# Patient Record
Sex: Male | Born: 1960 | Race: White | Hispanic: No | Marital: Single | Smoking: Current every day smoker
Health system: Southern US, Community
[De-identification: ages and names within clinical notes are randomized; demographics above are authoritative.]

## PROBLEM LIST (undated history)

## (undated) DIAGNOSIS — K029 Dental caries, unspecified: Secondary | ICD-10-CM

## (undated) DIAGNOSIS — G8929 Other chronic pain: Secondary | ICD-10-CM

## (undated) DIAGNOSIS — E119 Type 2 diabetes mellitus without complications: Secondary | ICD-10-CM

## (undated) DIAGNOSIS — E1140 Type 2 diabetes mellitus with diabetic neuropathy, unspecified: Secondary | ICD-10-CM

## (undated) DIAGNOSIS — K219 Gastro-esophageal reflux disease without esophagitis: Secondary | ICD-10-CM

## (undated) DIAGNOSIS — Z974 Presence of external hearing-aid: Secondary | ICD-10-CM

## (undated) DIAGNOSIS — F319 Bipolar disorder, unspecified: Secondary | ICD-10-CM

## (undated) DIAGNOSIS — Z87442 Personal history of urinary calculi: Secondary | ICD-10-CM

## (undated) DIAGNOSIS — R42 Dizziness and giddiness: Secondary | ICD-10-CM

## (undated) DIAGNOSIS — E7800 Pure hypercholesterolemia, unspecified: Secondary | ICD-10-CM

## (undated) DIAGNOSIS — A4902 Methicillin resistant Staphylococcus aureus infection, unspecified site: Secondary | ICD-10-CM

## (undated) DIAGNOSIS — F32A Depression, unspecified: Secondary | ICD-10-CM

## (undated) DIAGNOSIS — F329 Major depressive disorder, single episode, unspecified: Secondary | ICD-10-CM

## (undated) DIAGNOSIS — M109 Gout, unspecified: Secondary | ICD-10-CM

## (undated) DIAGNOSIS — H9190 Unspecified hearing loss, unspecified ear: Secondary | ICD-10-CM

## (undated) DIAGNOSIS — F419 Anxiety disorder, unspecified: Secondary | ICD-10-CM

## (undated) DIAGNOSIS — M549 Dorsalgia, unspecified: Secondary | ICD-10-CM

## (undated) DIAGNOSIS — I10 Essential (primary) hypertension: Secondary | ICD-10-CM

## (undated) HISTORY — PX: MULTIPLE TOOTH EXTRACTIONS: SHX2053

## (undated) HISTORY — PX: TONSILLECTOMY: SUR1361

## (undated) HISTORY — PX: HERNIA REPAIR: SHX51

---

## 2009-06-20 ENCOUNTER — Emergency Department (HOSPITAL_COMMUNITY): Admission: EM | Admit: 2009-06-20 | Discharge: 2009-06-20 | Payer: Self-pay | Admitting: Emergency Medicine

## 2009-09-12 ENCOUNTER — Encounter: Admission: RE | Admit: 2009-09-12 | Discharge: 2009-09-12 | Payer: Self-pay | Admitting: Family Medicine

## 2009-11-28 ENCOUNTER — Emergency Department (HOSPITAL_COMMUNITY): Admission: EM | Admit: 2009-11-28 | Discharge: 2009-11-28 | Payer: Self-pay | Admitting: Emergency Medicine

## 2010-02-23 ENCOUNTER — Emergency Department (HOSPITAL_COMMUNITY): Admission: EM | Admit: 2010-02-23 | Discharge: 2010-02-23 | Payer: Self-pay | Admitting: Emergency Medicine

## 2010-06-29 ENCOUNTER — Emergency Department (HOSPITAL_COMMUNITY): Admission: EM | Admit: 2010-06-29 | Discharge: 2010-06-30 | Payer: Self-pay | Admitting: Emergency Medicine

## 2011-01-29 LAB — DIFFERENTIAL
Eosinophils Absolute: 0.1 10*3/uL (ref 0.0–0.7)
Lymphs Abs: 3.2 10*3/uL (ref 0.7–4.0)
Monocytes Relative: 8 % (ref 3–12)
Neutro Abs: 7.5 10*3/uL (ref 1.7–7.7)
Neutrophils Relative %: 64 % (ref 43–77)

## 2011-01-29 LAB — CULTURE, BLOOD (ROUTINE X 2)
Culture: NO GROWTH
Culture: NO GROWTH

## 2011-01-29 LAB — CBC
Hemoglobin: 15.1 g/dL (ref 13.0–17.0)
MCH: 29.6 pg (ref 26.0–34.0)
MCHC: 34.8 g/dL (ref 30.0–36.0)
MCV: 85.1 fL (ref 78.0–100.0)
RBC: 5.11 MIL/uL (ref 4.22–5.81)
RDW: 12.7 % (ref 11.5–15.5)

## 2011-01-29 LAB — BASIC METABOLIC PANEL
BUN: 9 mg/dL (ref 6–23)
Calcium: 10 mg/dL (ref 8.4–10.5)
Chloride: 101 mEq/L (ref 96–112)
GFR calc non Af Amer: >60 mL/min (ref >60)
Glucose, Bld: 119 mg/dL — ABNORMAL HIGH (ref 70–99)
Potassium: 4 mEq/L (ref 3.5–5.1)
Sodium: 142 mEq/L (ref 135–145)

## 2011-01-29 LAB — URINALYSIS, ROUTINE W REFLEX MICROSCOPIC
Bilirubin Urine: NEGATIVE
Glucose, UA: NEGATIVE mg/dL
Hgb urine dipstick: NEGATIVE
Nitrite: NEGATIVE
Protein, ur: NEGATIVE mg/dL
Urobilinogen, UA: 0.2 mg/dL (ref 0.0–1.0)

## 2011-03-28 ENCOUNTER — Observation Stay (HOSPITAL_COMMUNITY)
Admission: EM | Admit: 2011-03-28 | Discharge: 2011-03-28 | Discharge status: Against Medical Advice | Payer: Medicaid Other | Attending: Emergency Medicine | Admitting: Emergency Medicine

## 2011-03-28 ENCOUNTER — Encounter (HOSPITAL_COMMUNITY): Payer: Self-pay | Admitting: Radiology

## 2011-03-28 ENCOUNTER — Emergency Department (HOSPITAL_COMMUNITY): Payer: Medicaid Other

## 2011-03-28 DIAGNOSIS — L03211 Cellulitis of face: Principal | ICD-10-CM | POA: Insufficient documentation

## 2011-03-28 DIAGNOSIS — L0201 Cutaneous abscess of face: Principal | ICD-10-CM | POA: Insufficient documentation

## 2011-03-28 LAB — CBC
Hemoglobin: 16.2 g/dL (ref 13.0–17.0)
MCH: 29.2 pg (ref 26.0–34.0)
MCV: 82.2 fL (ref 78.0–100.0)
Platelets: 259 10*3/uL (ref 150–400)

## 2011-03-28 LAB — BASIC METABOLIC PANEL
CO2: 28 mEq/L (ref 19–32)
Calcium: 10.9 mg/dL — ABNORMAL HIGH (ref 8.4–10.5)
Chloride: 102 mEq/L (ref 96–112)
Creatinine, Ser: 0.8 mg/dL (ref 0.4–1.5)
GFR calc Af Amer: >60 mL/min (ref >60)
GFR calc non Af Amer: >60 mL/min (ref >60)

## 2011-03-28 LAB — DIFFERENTIAL
Basophils Relative: 0 % (ref 0–1)
Eosinophils Relative: 0 % (ref 0–5)
Lymphocytes Relative: 20 % (ref 12–46)
Lymphs Abs: 1.8 10*3/uL (ref 0.7–4.0)
Monocytes Absolute: 0.8 10*3/uL (ref 0.1–1.0)

## 2011-03-28 MED ORDER — IOHEXOL 300 MG/ML  SOLN
80.0000 mL | Freq: Once | INTRAMUSCULAR | Status: AC | PRN
  Administered 2011-03-28: 80 mL via INTRAVENOUS

## 2011-08-25 ENCOUNTER — Emergency Department (HOSPITAL_COMMUNITY)
Admission: EM | Admit: 2011-08-25 | Discharge: 2011-08-25 | Disposition: A | Discharge status: Home / Self Care | Payer: Medicaid Other | Attending: Emergency Medicine | Admitting: Emergency Medicine

## 2011-08-25 DIAGNOSIS — R221 Localized swelling, mass and lump, neck: Secondary | ICD-10-CM | POA: Insufficient documentation

## 2011-08-25 DIAGNOSIS — L03211 Cellulitis of face: Secondary | ICD-10-CM | POA: Insufficient documentation

## 2011-08-25 DIAGNOSIS — J701 Chronic and other pulmonary manifestations due to radiation: Secondary | ICD-10-CM | POA: Insufficient documentation

## 2011-08-25 DIAGNOSIS — L0201 Cutaneous abscess of face: Secondary | ICD-10-CM | POA: Insufficient documentation

## 2011-08-25 DIAGNOSIS — I10 Essential (primary) hypertension: Secondary | ICD-10-CM | POA: Insufficient documentation

## 2011-08-25 DIAGNOSIS — R220 Localized swelling, mass and lump, head: Secondary | ICD-10-CM | POA: Insufficient documentation

## 2011-08-25 DIAGNOSIS — Z8614 Personal history of Methicillin resistant Staphylococcus aureus infection: Secondary | ICD-10-CM | POA: Insufficient documentation

## 2011-08-25 DIAGNOSIS — R51 Headache: Secondary | ICD-10-CM | POA: Insufficient documentation

## 2011-08-25 DIAGNOSIS — Z79899 Other long term (current) drug therapy: Secondary | ICD-10-CM | POA: Insufficient documentation

## 2011-08-26 ENCOUNTER — Emergency Department (HOSPITAL_COMMUNITY)
Admission: EM | Admit: 2011-08-26 | Discharge: 2011-08-26 | Disposition: A | Discharge status: Home / Self Care | Payer: Medicaid Other | Attending: Emergency Medicine | Admitting: Emergency Medicine

## 2011-08-26 DIAGNOSIS — L0201 Cutaneous abscess of face: Secondary | ICD-10-CM | POA: Insufficient documentation

## 2011-08-26 DIAGNOSIS — F430 Acute stress reaction: Secondary | ICD-10-CM | POA: Insufficient documentation

## 2011-08-26 DIAGNOSIS — L03211 Cellulitis of face: Secondary | ICD-10-CM | POA: Insufficient documentation

## 2011-08-26 DIAGNOSIS — I10 Essential (primary) hypertension: Secondary | ICD-10-CM | POA: Insufficient documentation

## 2011-08-27 ENCOUNTER — Emergency Department (HOSPITAL_COMMUNITY)
Admission: EM | Admit: 2011-08-27 | Discharge: 2011-08-27 | Disposition: A | Discharge status: Home / Self Care | Payer: Medicaid Other | Attending: Emergency Medicine | Admitting: Emergency Medicine

## 2011-08-27 DIAGNOSIS — L0201 Cutaneous abscess of face: Secondary | ICD-10-CM | POA: Insufficient documentation

## 2011-08-27 DIAGNOSIS — A4902 Methicillin resistant Staphylococcus aureus infection, unspecified site: Secondary | ICD-10-CM | POA: Insufficient documentation

## 2011-08-27 DIAGNOSIS — K219 Gastro-esophageal reflux disease without esophagitis: Secondary | ICD-10-CM | POA: Insufficient documentation

## 2011-08-27 DIAGNOSIS — I10 Essential (primary) hypertension: Secondary | ICD-10-CM | POA: Insufficient documentation

## 2011-08-27 DIAGNOSIS — L03211 Cellulitis of face: Secondary | ICD-10-CM | POA: Insufficient documentation

## 2011-08-27 DIAGNOSIS — R234 Changes in skin texture: Secondary | ICD-10-CM | POA: Insufficient documentation

## 2011-08-27 DIAGNOSIS — Z862 Personal history of diseases of the blood and blood-forming organs and certain disorders involving the immune mechanism: Secondary | ICD-10-CM | POA: Insufficient documentation

## 2011-08-27 DIAGNOSIS — Z8639 Personal history of other endocrine, nutritional and metabolic disease: Secondary | ICD-10-CM | POA: Insufficient documentation

## 2012-02-17 ENCOUNTER — Emergency Department (HOSPITAL_COMMUNITY)
Admission: EM | Admit: 2012-02-17 | Discharge: 2012-02-17 | Disposition: A | Discharge status: Home / Self Care | Payer: Medicaid Other | Attending: Emergency Medicine | Admitting: Emergency Medicine

## 2012-02-17 ENCOUNTER — Encounter (HOSPITAL_COMMUNITY): Payer: Self-pay

## 2012-02-17 DIAGNOSIS — M549 Dorsalgia, unspecified: Secondary | ICD-10-CM | POA: Insufficient documentation

## 2012-02-17 DIAGNOSIS — F17200 Nicotine dependence, unspecified, uncomplicated: Secondary | ICD-10-CM | POA: Insufficient documentation

## 2012-02-17 DIAGNOSIS — I10 Essential (primary) hypertension: Secondary | ICD-10-CM | POA: Insufficient documentation

## 2012-02-17 DIAGNOSIS — G8929 Other chronic pain: Secondary | ICD-10-CM | POA: Insufficient documentation

## 2012-02-17 DIAGNOSIS — J340 Abscess, furuncle and carbuncle of nose: Secondary | ICD-10-CM

## 2012-02-17 DIAGNOSIS — M5431 Sciatica, right side: Secondary | ICD-10-CM

## 2012-02-17 HISTORY — DX: Essential (primary) hypertension: I10

## 2012-02-17 HISTORY — DX: Other chronic pain: G89.29

## 2012-02-17 HISTORY — DX: Dorsalgia, unspecified: M54.9

## 2012-02-17 MED ORDER — SULFAMETHOXAZOLE-TRIMETHOPRIM 800-160 MG PO TABS: 1.0000 | ORAL_TABLET | Freq: Two times a day (BID) | ORAL | Status: AC

## 2012-02-17 MED ORDER — OXYCODONE-ACETAMINOPHEN 5-325 MG PO TABS: 1.0000 | ORAL_TABLET | Freq: Four times a day (QID) | ORAL | Status: AC | PRN

## 2012-02-17 MED ORDER — ZOLPIDEM TARTRATE 5 MG PO TABS: 5.0000 mg | ORAL_TABLET | Freq: Every evening | ORAL | Status: DC | PRN

## 2012-02-17 MED ORDER — CLINDAMYCIN HCL 150 MG PO CAPS: 150.0000 mg | ORAL_CAPSULE | Freq: Four times a day (QID) | ORAL | Status: AC

## 2012-02-17 NOTE — ED Notes (Signed)
Moved to CDU for rectal exam.

## 2012-02-17 NOTE — ED Notes (Signed)
Pt presents with abscess to R nare that he first noted today.  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  Pt reports old [facility] injury above R hip that began again x 10 days ago.  Pt reports pain radiates down R leg, denies any urinary or fecal incontinence.
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
## 2012-02-17 NOTE — Discharge Instructions (Signed)
Please follow up with neurosurgeon, Dr. Gerlene Fee for further evaluation of your back pain.  Take medications for symptomatic treatment but do not drive or operate heavy machinery as it may cause drowsiness.  Take antibiotic as prescribed.  If your nose infection continue to swell you may need to return to have it lanced.  Cellulitis Cellulitis is an infection of the skin and the tissue beneath it. The area is typically red and tender. It is caused by germs (bacteria) (usually staph or strep) that enter the body through cuts or sores. Cellulitis most commonly occurs in the arms or lower legs.  HOME CARE INSTRUCTIONS   If you are given a prescription for medications which kill germs (antibiotics), take as directed until finished.   If the infection is on the arm or leg, keep the limb elevated as able.   Use a warm cloth several times per day to relieve pain and encourage healing.   See your caregiver for recheck of the infected site as directed if problems arise.   Only take over-the-counter or prescription medicines for pain, discomfort, or fever as directed by your caregiver.  SEEK MEDICAL CARE IF:   The area of redness (inflammation) is spreading, there are red streaks coming from the infected site, or if a part of the infection begins to turn dark in color.   The joint or bone underneath the infected skin becomes painful after the skin has healed.   The infection returns in the same or another area after it seems to have gone away.   A boil or bump swells up. This may be an abscess.   New, unexplained problems such as pain or fever develop.  SEEK IMMEDIATE MEDICAL CARE IF:   You have a fever.   You or your child feels drowsy or lethargic.   There is vomiting, diarrhea, or lasting discomfort or feeling ill (malaise) with muscle aches and pains.  MAKE SURE YOU:   Understand these instructions.   Will watch your condition.   Will get help right away if you are not doing well or  get worse.  Document Released: 08/11/2005 Document Revised: 10/21/2011 Document Reviewed: 06/19/2008 [facility] Patient Information 2012 [place], [place].  Sciatica Sciatica is a weakness and/or changes in sensation (tingling, jolts, hot and cold, numbness) along the path the sciatic nerve travels. Irritation or damage to lumbar nerve roots is often also referred to as lumbar radiculopathy.  Lumbar radiculopathy (Sciatica) is the most common form of this problem. Radiculopathy can occur in any of the nerves coming out of the spinal cord. The problems caused depend on which nerves are involved. The sciatic nerve is the large nerve supplying the branches of nerves going from the hip to the toes. It often causes a numbness or weakness in the skin and/or muscles that the sciatic nerve serves. It also may cause symptoms (problems) of pain, burning, tingling, or electric shock-like feelings in the path of this nerve. This usually comes from injury to the fibers that make up the sciatic nerve. Some of these symptoms are low back pain and/or unpleasant feelings in the following areas:  From the mid-buttock down the back of the leg to the back of the knee.   And/or the outside of the calf and top of the foot.   And/or behind the inner ankle to the sole of the foot.  CAUSES   Herniated or slipped disc. Discs are the little cushions between the bones in the back.   Pressure by  the piriformis muscle in the buttock on the sciatic nerve (Piriformis Syndrome).   Misalignment of the bones in the lower back and buttocks (Sacroiliac Joint Derangement).   Narrowing of the spinal canal that puts pressure on or pinches the fibers that make up the sciatic nerve.   A slipped vertebra that is out of line with those above or beneath it.   Abnormality of the nervous system itself so that nerve fibers do not transmit signals properly, especially to feet and calves (neuropathy).   Tumor (this is rare).  Your caregiver  can usually determine the cause of your sciatica and begin the treatment most likely to help you. TREATMENT  Taking over-the-counter painkillers, physical therapy, rest, exercise, spinal manipulation, and injections of anesthetics and/or steroids may be used. Surgery, acupuncture, and Yoga can also be effective. Mind over matter techniques, mental imagery, and changing factors such as your bed, chair, desk height, posture, and activities are other treatments that may be helpful. You and your caregiver can help determine what is best for you. With proper diagnosis, the cause of most sciatica can be identified and removed. Communication and cooperation between your caregiver and you is essential. If you are not successful immediately, do not be discouraged. With time, a proper treatment can be found that will make you comfortable. HOME CARE INSTRUCTIONS   If the pain is coming from a problem in the back, applying ice to that area for 15 to 20 minutes, 3 to 4 times per day while awake, may be helpful. Put the ice in a plastic bag. Place a towel between the bag of ice and your skin.   You may exercise or perform your usual activities if these do not aggravate your pain, or as suggested by your caregiver.   Only take over-the-counter or prescription medicines for pain, discomfort, or fever as directed by your caregiver.   If your caregiver has given you a follow-up appointment, it is very important to keep that appointment. Not keeping the appointment could result in a chronic or permanent injury, pain, and disability. If there is any problem keeping the appointment, you must call back to this facility for assistance.  SEEK IMMEDIATE MEDICAL CARE IF:   You experience loss of control of bowel or bladder.   You have increasing weakness in the trunk, buttocks, or legs.   There is numbness in any areas from the hip down to the toes.   You have difficulty walking or keeping your balance.   You have any  of the above, with fever or forceful vomiting.  Document Released: 10/26/2001 Document Revised: 10/21/2011 Document Reviewed: 06/14/2008 [facility] Patient Information 2012 [place], [place].

## 2012-02-18 NOTE — ED Provider Notes (Signed)
Medical screening examination/treatment/procedure(s) were performed by non-physician practitioner and as supervising physician I was immediately available for consultation/collaboration.  Glynn Octave, MD 02/18/12 1139

## 2012-02-19 NOTE — ED Provider Notes (Signed)
Medical screening examination/treatment/procedure(s) were performed by non-physician practitioner and as supervising physician I was immediately available for consultation/collaboration.   Glynn Octave, MD 02/19/12 [phone]

## 2012-07-01 ENCOUNTER — Emergency Department (HOSPITAL_COMMUNITY): Payer: Medicaid Other

## 2012-07-01 ENCOUNTER — Encounter (HOSPITAL_COMMUNITY): Payer: Self-pay | Admitting: *Deleted

## 2012-07-01 ENCOUNTER — Emergency Department (HOSPITAL_COMMUNITY)
Admission: EM | Admit: 2012-07-01 | Discharge: 2012-07-01 | Disposition: A | Discharge status: Home / Self Care | Payer: Medicaid Other | Attending: Emergency Medicine | Admitting: Emergency Medicine

## 2012-07-01 DIAGNOSIS — I10 Essential (primary) hypertension: Secondary | ICD-10-CM | POA: Insufficient documentation

## 2012-07-01 DIAGNOSIS — F17200 Nicotine dependence, unspecified, uncomplicated: Secondary | ICD-10-CM | POA: Insufficient documentation

## 2012-07-01 DIAGNOSIS — M25579 Pain in unspecified ankle and joints of unspecified foot: Secondary | ICD-10-CM | POA: Insufficient documentation

## 2012-07-01 DIAGNOSIS — S82839A Other fracture of upper and lower end of unspecified fibula, initial encounter for closed fracture: Secondary | ICD-10-CM

## 2012-07-01 DIAGNOSIS — Z888 Allergy status to other drugs, medicaments and biological substances status: Secondary | ICD-10-CM | POA: Insufficient documentation

## 2012-07-01 DIAGNOSIS — Z79899 Other long term (current) drug therapy: Secondary | ICD-10-CM | POA: Insufficient documentation

## 2012-07-01 DIAGNOSIS — X500XXA Overexertion from strenuous movement or load, initial encounter: Secondary | ICD-10-CM | POA: Insufficient documentation

## 2012-07-01 HISTORY — DX: Gastro-esophageal reflux disease without esophagitis: K21.9

## 2012-07-01 MED ORDER — IBUPROFEN 400 MG PO TABS
800.0000 mg | ORAL_TABLET | Freq: Once | ORAL | Status: AC
  Administered 2012-07-01: 800 mg via ORAL
  Filled 2012-07-01: qty 2

## 2012-07-01 MED ORDER — HYDROCODONE-ACETAMINOPHEN 5-325 MG PO TABS: 1.0000 | ORAL_TABLET | Freq: Four times a day (QID) | ORAL | Status: AC | PRN

## 2012-07-01 MED ORDER — HYDROCODONE-ACETAMINOPHEN 5-325 MG PO TABS
1.0000 | ORAL_TABLET | Freq: Once | ORAL | Status: AC
  Administered 2012-07-01: 1 via ORAL
  Filled 2012-07-01: qty 1

## 2012-07-01 NOTE — ED Notes (Signed)
Reports right ankle pain x 2 months, denies injury to ankle. Ambulatory at triage.
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
## 2012-07-01 NOTE — Progress Notes (Signed)
Orthopedic Tech Progress Note Patient Details:  Shawn Travis 10-27-1961 [id number]  Ortho Devices Type of Ortho Device: CAM walker Ortho Device/Splint Location: right LE Ortho Device/Splint Interventions: Application   [name] 07/01/2012, 12:15 PM

## 2012-07-01 NOTE — ED Notes (Signed)
Pt hypertensive at triage, hx of htn and off meds x 2 months

## 2012-07-01 NOTE — ED Notes (Addendum)
Pt is agitated, states he has been waiting too long and he does not want to wait anymore. States he wants to see supervisor. i tried to explain ED process to pt but he would not listen. Security and charge called, to bedside, and edpa to bedside.

## 2012-07-07 NOTE — ED Provider Notes (Signed)
Medical screening examination/treatment/procedure(s) were performed by non-physician practitioner and as supervising physician I was immediately available for consultation/collaboration.  Jahleel Stroschein T Zayden Hahne, MD 07/07/12 0743 

## 2012-09-01 ENCOUNTER — Emergency Department (HOSPITAL_COMMUNITY)
Admission: EM | Admit: 2012-09-01 | Discharge: 2012-09-01 | Disposition: A | Discharge status: Home / Self Care | Payer: Medicaid Other | Attending: Emergency Medicine | Admitting: Emergency Medicine

## 2012-09-01 ENCOUNTER — Emergency Department (HOSPITAL_COMMUNITY): Payer: Medicaid Other

## 2012-09-01 ENCOUNTER — Encounter (HOSPITAL_COMMUNITY): Payer: Self-pay

## 2012-09-01 DIAGNOSIS — M5030 Other cervical disc degeneration, unspecified cervical region: Secondary | ICD-10-CM | POA: Insufficient documentation

## 2012-09-01 DIAGNOSIS — M79609 Pain in unspecified limb: Secondary | ICD-10-CM | POA: Insufficient documentation

## 2012-09-01 DIAGNOSIS — R071 Chest pain on breathing: Secondary | ICD-10-CM | POA: Insufficient documentation

## 2012-09-01 DIAGNOSIS — M542 Cervicalgia: Secondary | ICD-10-CM | POA: Insufficient documentation

## 2012-09-01 DIAGNOSIS — I10 Essential (primary) hypertension: Secondary | ICD-10-CM | POA: Insufficient documentation

## 2012-09-01 DIAGNOSIS — F17200 Nicotine dependence, unspecified, uncomplicated: Secondary | ICD-10-CM | POA: Insufficient documentation

## 2012-09-01 DIAGNOSIS — S0990XA Unspecified injury of head, initial encounter: Secondary | ICD-10-CM | POA: Insufficient documentation

## 2012-09-01 DIAGNOSIS — R079 Chest pain, unspecified: Secondary | ICD-10-CM | POA: Insufficient documentation

## 2012-09-01 DIAGNOSIS — T1490XA Injury, unspecified, initial encounter: Secondary | ICD-10-CM | POA: Insufficient documentation

## 2012-09-01 DIAGNOSIS — R0789 Other chest pain: Secondary | ICD-10-CM

## 2012-09-01 MED ORDER — HYDROCODONE-ACETAMINOPHEN 5-325 MG PO TABS
1.0000 | ORAL_TABLET | Freq: Once | ORAL | Status: AC
  Administered 2012-09-01: 1 via ORAL
  Filled 2012-09-01: qty 1

## 2012-09-01 MED ORDER — HYDROCODONE-ACETAMINOPHEN 5-325 MG PO TABS: 1.0000 | ORAL_TABLET | Freq: Four times a day (QID) | ORAL | Status: DC | PRN

## 2012-09-01 MED ORDER — IBUPROFEN 800 MG PO TABS: 800.0000 mg | ORAL_TABLET | Freq: Three times a day (TID) | ORAL | Status: DC | PRN

## 2012-09-01 MED ORDER — CYCLOBENZAPRINE HCL 10 MG PO TABS: 10.0000 mg | ORAL_TABLET | Freq: Three times a day (TID) | ORAL | Status: DC | PRN
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
## 2012-09-07 NOTE — ED Provider Notes (Signed)
Medical screening examination/treatment/procedure(s) were performed by non-physician practitioner and as supervising physician I was immediately available for consultation/collaboration.  Derwood Kaplan, MD 09/07/12 1512

## 2012-09-16 ENCOUNTER — Encounter (HOSPITAL_COMMUNITY): Payer: Self-pay | Admitting: Nurse Practitioner

## 2012-09-16 ENCOUNTER — Emergency Department (HOSPITAL_COMMUNITY): Payer: Medicaid Other

## 2012-09-16 ENCOUNTER — Other Ambulatory Visit: Payer: Self-pay

## 2012-09-16 ENCOUNTER — Emergency Department (HOSPITAL_COMMUNITY)
Admission: EM | Admit: 2012-09-16 | Discharge: 2012-09-16 | Disposition: A | Discharge status: Home / Self Care | Payer: Medicaid Other | Attending: Emergency Medicine | Admitting: Emergency Medicine

## 2012-09-16 DIAGNOSIS — Z8719 Personal history of other diseases of the digestive system: Secondary | ICD-10-CM | POA: Insufficient documentation

## 2012-09-16 DIAGNOSIS — G8929 Other chronic pain: Secondary | ICD-10-CM | POA: Insufficient documentation

## 2012-09-16 DIAGNOSIS — R071 Chest pain on breathing: Secondary | ICD-10-CM | POA: Insufficient documentation

## 2012-09-16 DIAGNOSIS — I10 Essential (primary) hypertension: Secondary | ICD-10-CM | POA: Insufficient documentation

## 2012-09-16 DIAGNOSIS — R0789 Other chest pain: Secondary | ICD-10-CM

## 2012-09-16 DIAGNOSIS — M549 Dorsalgia, unspecified: Secondary | ICD-10-CM | POA: Insufficient documentation

## 2012-09-16 DIAGNOSIS — F17200 Nicotine dependence, unspecified, uncomplicated: Secondary | ICD-10-CM | POA: Insufficient documentation

## 2012-09-16 LAB — CBC WITH DIFFERENTIAL/PLATELET
Basophils Absolute: 0 10*3/uL (ref 0.0–0.1)
HCT: 43.6 % (ref 39.0–52.0)
Lymphocytes Relative: 27 % (ref 12–46)
Monocytes Absolute: 1 10*3/uL (ref 0.1–1.0)
Neutro Abs: 6.4 10*3/uL (ref 1.7–7.7)
RBC: 5.31 MIL/uL (ref 4.22–5.81)
RDW: 13 % (ref 11.5–15.5)
WBC: 10.2 10*3/uL (ref 4.0–10.5)

## 2012-09-16 LAB — BASIC METABOLIC PANEL
CO2: 27 mEq/L (ref 19–32)
Chloride: 105 mEq/L (ref 96–112)
Creatinine, Ser: 1.03 mg/dL (ref 0.50–1.35)
Sodium: 142 mEq/L (ref 135–145)

## 2012-09-16 LAB — D-DIMER, QUANTITATIVE: D-Dimer, Quant: 0.27 ug/mL-FEU (ref 0.00–0.48)

## 2012-09-16 LAB — TROPONIN I: Troponin I: <0.3 ng/mL (ref <0.30)

## 2012-09-16 MED ORDER — IBUPROFEN 800 MG PO TABS: 800.0000 mg | ORAL_TABLET | Freq: Three times a day (TID) | ORAL | Status: DC

## 2012-09-16 MED ORDER — KETOROLAC TROMETHAMINE 60 MG/2ML IM SOLN
60.0000 mg | Freq: Once | INTRAMUSCULAR | Status: AC
  Administered 2012-09-16: 60 mg via INTRAMUSCULAR
  Filled 2012-09-16: qty 2

## 2012-09-16 MED ORDER — HYDROCODONE-ACETAMINOPHEN 5-325 MG PO TABS: 2.0000 | ORAL_TABLET | ORAL | Status: DC | PRN

## 2012-09-16 MED ORDER — OXYCODONE-ACETAMINOPHEN 5-325 MG PO TABS
2.0000 | ORAL_TABLET | Freq: Once | ORAL | Status: AC
  Administered 2012-09-16: 2 via ORAL
  Filled 2012-09-16: qty 2
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
## 2012-09-16 NOTE — ED Notes (Signed)
States he is unable to afford his 2 BP pills, has not been taking for past few months

## 2012-12-17 ENCOUNTER — Encounter (HOSPITAL_COMMUNITY): Payer: Self-pay | Admitting: Emergency Medicine

## 2012-12-17 ENCOUNTER — Emergency Department (HOSPITAL_COMMUNITY): Payer: Medicaid Other

## 2012-12-17 ENCOUNTER — Emergency Department (HOSPITAL_COMMUNITY)
Admission: EM | Admit: 2012-12-17 | Discharge: 2012-12-17 | Disposition: A | Discharge status: Home / Self Care | Payer: Medicaid Other | Attending: Emergency Medicine | Admitting: Emergency Medicine

## 2012-12-17 DIAGNOSIS — N200 Calculus of kidney: Secondary | ICD-10-CM

## 2012-12-17 DIAGNOSIS — F17200 Nicotine dependence, unspecified, uncomplicated: Secondary | ICD-10-CM | POA: Insufficient documentation

## 2012-12-17 DIAGNOSIS — M549 Dorsalgia, unspecified: Secondary | ICD-10-CM | POA: Insufficient documentation

## 2012-12-17 DIAGNOSIS — I10 Essential (primary) hypertension: Secondary | ICD-10-CM | POA: Insufficient documentation

## 2012-12-17 DIAGNOSIS — N201 Calculus of ureter: Secondary | ICD-10-CM

## 2012-12-17 DIAGNOSIS — R319 Hematuria, unspecified: Secondary | ICD-10-CM | POA: Insufficient documentation

## 2012-12-17 DIAGNOSIS — Z8719 Personal history of other diseases of the digestive system: Secondary | ICD-10-CM | POA: Insufficient documentation

## 2012-12-17 DIAGNOSIS — G8929 Other chronic pain: Secondary | ICD-10-CM | POA: Insufficient documentation

## 2012-12-17 LAB — URINALYSIS, ROUTINE W REFLEX MICROSCOPIC
Bilirubin Urine: NEGATIVE
Glucose, UA: NEGATIVE mg/dL
Ketones, ur: 40 mg/dL — AB
Nitrite: NEGATIVE
Protein, ur: 30 mg/dL — AB
Specific Gravity, Urine: 1.025 (ref 1.005–1.030)
Urobilinogen, UA: 1 mg/dL (ref 0.0–1.0)
pH: 6 (ref 5.0–8.0)

## 2012-12-17 LAB — POCT I-STAT, CHEM 8
BUN: 12 mg/dL (ref 6–23)
Calcium, Ion: 1.21 mmol/L (ref 1.12–1.23)
Chloride: 108 meq/L (ref 96–112)
Creatinine, Ser: 1.3 mg/dL (ref 0.50–1.35)
Glucose, Bld: 107 mg/dL — ABNORMAL HIGH (ref 70–99)
HCT: 44 % (ref 39.0–52.0)
Hemoglobin: 15 g/dL (ref 13.0–17.0)
Potassium: 3.8 mEq/L (ref 3.5–5.1)
Sodium: 142 mEq/L (ref 135–145)
TCO2: 23 mmol/L (ref 0–100)

## 2012-12-17 LAB — URINE MICROSCOPIC-ADD ON

## 2012-12-17 MED ORDER — HYDROMORPHONE HCL PF 1 MG/ML IJ SOLN
1.0000 mg | Freq: Once | INTRAMUSCULAR | Status: AC
  Administered 2012-12-17: 1 mg via INTRAVENOUS
  Filled 2012-12-17: qty 1

## 2012-12-17 MED ORDER — SODIUM CHLORIDE 0.9 % IV SOLN
Freq: Once | INTRAVENOUS | Status: AC
  Administered 2012-12-17: 11:00:00 via INTRAVENOUS

## 2012-12-17 MED ORDER — ONDANSETRON 8 MG PO TBDP: 8.0000 mg | ORAL_TABLET | Freq: Two times a day (BID) | ORAL | Status: DC | PRN

## 2012-12-17 MED ORDER — ONDANSETRON HCL 4 MG/2ML IJ SOLN
4.0000 mg | Freq: Once | INTRAMUSCULAR | Status: AC
  Administered 2012-12-17: 4 mg via INTRAVENOUS
  Filled 2012-12-17: qty 2

## 2012-12-17 MED ORDER — KETOROLAC TROMETHAMINE 30 MG/ML IJ SOLN
30.0000 mg | Freq: Once | INTRAMUSCULAR | Status: AC
  Administered 2012-12-17: 30 mg via INTRAVENOUS
  Filled 2012-12-17: qty 1

## 2012-12-17 MED ORDER — OXYCODONE-ACETAMINOPHEN 5-325 MG PO TABS: ORAL_TABLET | ORAL | Status: DC

## 2012-12-17 NOTE — ED Notes (Signed)
Report to Melissa, RN

## 2012-12-17 NOTE — ED Notes (Signed)
Pt. Stated, i got up to go to work and I started having this sharpe pain that goes across my back and stomach like a knife.  I had to get somebody to drive me here.

## 2012-12-17 NOTE — Discharge Instructions (Signed)
 Kidney Stones Kidney stones (ureteral lithiasis) are deposits that form inside your kidneys. The intense pain is caused by the stone moving through the urinary tract. When the stone moves, the ureter goes into spasm around the stone. The stone is usually passed in the urine.  CAUSES   A disorder that makes certain neck glands produce too much parathyroid hormone (primary hyperparathyroidism).  A buildup of uric acid crystals.  Narrowing (stricture) of the ureter.  A kidney obstruction present at birth (congenital obstruction).  Previous surgery on the kidney or ureters.  Numerous kidney infections. SYMPTOMS   Feeling sick to your stomach (nauseous).  Throwing up (vomiting).  Blood in the urine (hematuria).  Pain that usually spreads (radiates) to the groin.  Frequency or urgency of urination. DIAGNOSIS   Taking a history and physical exam.  Blood or urine tests.  Computerized X-ray scan (CT scan).  Occasionally, an examination of the inside of the urinary bladder (cystoscopy) is performed. TREATMENT   Observation.  Increasing your fluid intake.  Surgery may be needed if you have severe pain or persistent obstruction. The size, location, and chemical composition are all important variables that will determine the proper choice of action for you. Talk to your caregiver to better understand your situation so that you will minimize the risk of injury to yourself and your kidney.  HOME CARE INSTRUCTIONS   Drink enough water and fluids to keep your urine clear or pale yellow.  Strain all urine through the provided strainer. Keep all particulate matter and stones for your caregiver to see. The stone causing the pain may be as small as a grain of salt. It is very important to use the strainer each and every time you pass your urine. The collection of your stone will allow your caregiver to analyze it and verify that a stone has actually passed.  Only take over-the-counter or  prescription medicines for pain, discomfort, or fever as directed by your caregiver.  Make a follow-up appointment with your caregiver as directed.  Get follow-up X-rays if required. The absence of pain does not always mean that the stone has passed. It may have only stopped moving. If the urine remains completely obstructed, it can cause loss of kidney function or even complete destruction of the kidney. It is your responsibility to make sure X-rays and follow-ups are completed. Ultrasounds of the kidney can show blockages and the status of the kidney. Ultrasounds are not associated with any radiation and can be performed easily in a matter of minutes. SEEK IMMEDIATE MEDICAL CARE IF:   Pain cannot be controlled with the prescribed medicine.  You have a fever.  The severity or intensity of pain increases over 18 hours and is not relieved by pain medicine.  You develop a new onset of abdominal pain.  You feel faint or pass out. MAKE SURE YOU:   Understand these instructions.  Will watch your condition.  Will get help right away if you are not doing well or get worse. Document Released: 11/01/2005 Document Revised: 01/24/2012 Document Reviewed: 02/27/2010 [facility] Patient Information 2013 [place], [place].    Narcotic and benzodiazepine use may cause drowsiness, slowed breathing or dependence.  Please use with caution and do not drive, operate machinery or watch young children alone while taking them.  Taking combinations of these medications or drinking alcohol will potentiate these effects.    Kidney Stones Kidney stones (ureteral lithiasis) are deposits that form inside your kidneys. The intense pain is caused by the  stone moving through the urinary tract. When the stone moves, the ureter goes into spasm around the stone. The stone is usually passed in the urine.  CAUSES   A disorder that makes certain neck glands produce too much parathyroid hormone (primary  hyperparathyroidism).  A buildup of uric acid crystals.  Narrowing (stricture) of the ureter.  A kidney obstruction present at birth (congenital obstruction).  Previous surgery on the kidney or ureters.  Numerous kidney infections. SYMPTOMS   Feeling sick to your stomach (nauseous).  Throwing up (vomiting).  Blood in the urine (hematuria).  Pain that usually spreads (radiates) to the groin.  Frequency or urgency of urination. DIAGNOSIS   Taking a history and physical exam.  Blood or urine tests.  Computerized X-ray scan (CT scan).  Occasionally, an examination of the inside of the urinary bladder (cystoscopy) is performed. TREATMENT   Observation.  Increasing your fluid intake.  Surgery may be needed if you have severe pain or persistent obstruction. The size, location, and chemical composition are all important variables that will determine the proper choice of action for you. Talk to your caregiver to better understand your situation so that you will minimize the risk of injury to yourself and your kidney.  HOME CARE INSTRUCTIONS   Drink enough water and fluids to keep your urine clear or pale yellow.  Strain all urine through the provided strainer. Keep all particulate matter and stones for your caregiver to see. The stone causing the pain may be as small as a grain of salt. It is very important to use the strainer each and every time you pass your urine. The collection of your stone will allow your caregiver to analyze it and verify that a stone has actually passed.  Only take over-the-counter or prescription medicines for pain, discomfort, or fever as directed by your caregiver.  Make a follow-up appointment with your caregiver as directed.  Get follow-up X-rays if required. The absence of pain does not always mean that the stone has passed. It may have only stopped moving. If the urine remains completely obstructed, it can cause loss of kidney function or even  complete destruction of the kidney. It is your responsibility to make sure X-rays and follow-ups are completed. Ultrasounds of the kidney can show blockages and the status of the kidney. Ultrasounds are not associated with any radiation and can be performed easily in a matter of minutes. SEEK IMMEDIATE MEDICAL CARE IF:   Pain cannot be controlled with the prescribed medicine.  You have a fever.  The severity or intensity of pain increases over 18 hours and is not relieved by pain medicine.  You develop a new onset of abdominal pain.  You feel faint or pass out. MAKE SURE YOU:   Understand these instructions.  Will watch your condition.  Will get help right away if you are not doing well or get worse. Document Released: 11/01/2005 Document Revised: 01/24/2012 Document Reviewed: 02/27/2010 [facility] Patient Information 2013 [place], [place].    Diet for Kidney Stones Kidney stones are small, hard masses that form inside your kidneys. They are made up of salts and minerals and often form when high levels build up in the urine. The minerals can then start to build up, crystalize, and stick together to form stones. There are several different types of kidney stones. The following types of stones may be influenced by dietary factors:   Calcium Oxalate Stones. An oxalate is a salt found in certain foods. Within the body,  calcium can combine with oxalates to form calcium oxalate stones, which can be excreted in the urine in high amounts. This is the most common type of kidney stone.  Calcium Phosphate Stones. These stones may occur when the pH of the urine becomes too high, or less acidic, from too much calcium being excreted in the urine. The pH is a measure of how acidic or basic a substance is.  Uric Acid Stones. This type of stone occurs when the pH of the urine becomes too low, or very acidic, because substances called purines build up in the urine. Purines are found in animal proteins.  When the urine is highly concentrated with acid, uric acid kidney stones can form.  Other risk factors for kidney stones include genetics, environment, and being overweight. Your caregiver may ask you to follow specific diet guidelines based on the type of stone you have to lessen the chances of your body making more kidney stones.  GENERAL GUIDELINES FOR ALL TYPES OF STONES  Drink plenty of fluid. Drink 12 16 cups of fluid a day, drinking mainly water.This is the most important thing you can do to prevent the formation of future kidney stones.  Maintain a healthy weight. Your caregiver or dietitian can help you determine what a healthy weight is for you. If you are overweight, weight loss may help prevent the formation of future kidney stones.  Eat a diet adequate in animal protein. Too much animal protein can contribute to the formation of stones. Your dietitian can help you determine how much protein you should be eating. Avoid low carbohydrate, high protein diets.  Follow a balanced eating approach. The DASH diet, which stands for Dietary Approaches to Stop Hypertension, is an effective meal plan for reducing stone formation. This diet is high in fruits, vegetables, dairy, and whole grains and low in animal protein. Ask your caregiver or dietitian for information about the DASH diet. ADDITIONAL DIET GUIDELINES FOR CALCIUM STONES Avoid foods high in salt. This includes table salt, salt seasonings, MSG, soy sauce, cured and processed meats, salted crackers and snack foods, fast food, and canned soups and foods. Ask your caregiver or dietitian for information about reducing sodium in your diet or following the low sodium diet.  Ensure adequate calcium intake. Use the following table for calcium guidelines:  Men 95 years old and younger  1000 mg/day.  Men 90 years old and older  1500 mg/day.  Women 47 50 years old  1000 mg/day.  Women 50 years and older  1500 mg/day. Your dietitian can help  you determine if you are getting enough calcium in your diet. Foods that are high in calcium include dairy products, broccoli, cheese, yogurt, and pudding. If you need to take a calcium supplement, take it only in the form of calcium citrate.  Avoid foods high in oxalate. Be sure that any supplements you take do not contain more than 500 mg of vitamin C. Vitamin C is converted into oxalate in the body. You do not need to avoid fruits and vegetables high in vitamin C.   Grains: High-fiber or bran cereal, whole-wheat bread, grits, barley, buckwheat, amaranth, pretzels, and fruitcake.  Vegetables: Dried beans, wax beans, dark leafy greens, eggplant, leeks, okra, parsley, rutabaga, tomato paste, watercress, zucchini, and escarole.  Fruit: Dried apricots, red currants, figs, kiwi, and rhubarb.  Meat and Meat Substitutes: Soybeans and foods made from soy (soyburger, miso), dried beans, peanut butter.  Milk: Chocolate milk mixes and soymilk.  Fats and  Oils: Nuts (peanuts, almonds, pecans, cashews, hazelnuts) and nut butters, sesame seeds, and tDahini paste.  Condiments/Miscellaneous: Chocolate, carob, marmalade, poppy seeds, instant iced tea, and juice from high-oxalate fruits.  Document Released: 02/26/2011 Document Revised: 05/02/2012 Document Reviewed: 04/17/2012 [facility] Patient Information 2013 [place], [place].     Narcotic and benzodiazepine use may cause drowsiness, slowed breathing or dependence.  Please use with caution and do not drive, operate machinery or watch young children alone while taking them.  Taking combinations of these medications or drinking alcohol will potentiate these effects.

## 2012-12-17 NOTE — ED Provider Notes (Signed)
 History     CSN: [id number]  Arrival date & time 12/17/12  0910   First MD Initiated Contact with Patient 12/17/12 [phone]      Chief Complaint  Patient presents with  . Abdominal Pain    (Consider location/radiation/quality/duration/timing/severity/associated sxs/prior treatment) HPI Comments: Level 5 caveat due to severe pain.  Patient had rather sudden onset of left lower quadrant abdominal pain that radiated down toward his left groin and testicle. He denies acute testicular pain or scrotal swelling. No penile discharge, pain or rash. He reports no nausea or vomiting. Patient reports he tried to go to work but was unable to. He reports holding himself in the left lower quadrant seemed to slightly eased the pain but nothing else seemed to change it. He reports no history of kidney stones or kidney infection. He does have a history of hypertension, diabetes and renal insufficiency based on review of his prior notes. Patient reports urine looks dark and he did see some red blood in it.  Patient is a 52 y.o. male presenting with abdominal pain. The history is provided by the patient.  Abdominal Pain The primary symptoms of the illness include abdominal pain.    Past Medical History  Diagnosis Date  . Hypertension   . Chronic back pain   . Acid reflux     History reviewed. No pertinent past surgical history.  History reviewed. No pertinent family history.  History  Substance Use Topics  . Smoking status: Current Every Day Smoker -- 2.0 packs/day  . Smokeless tobacco: Not on file  . Alcohol Use: No      Review of Systems  Unable to perform ROS: Other  Gastrointestinal: Positive for abdominal pain.    Allergies  Hydrochlorothiazide  Home Medications  No current outpatient prescriptions on file.  BP 223/130  Pulse 68  Temp 97 F (36.1 C) (Oral)  Resp 18  SpO2 100%  Physical Exam  Nursing note and vitals reviewed. Constitutional: He is oriented to person, place,  and time. He appears well-developed and well-nourished.  HENT:  Head: Normocephalic and atraumatic.  Eyes: Conjunctivae normal and EOM are normal.  Neck: Normal range of motion. Neck supple.  Cardiovascular: Normal rate and regular rhythm.   Pulmonary/Chest: Effort normal. No respiratory distress.  Abdominal: Soft. He exhibits no distension. There is tenderness. There is CVA tenderness. There is no rebound and no guarding.  Neurological: He is alert and oriented to person, place, and time.  Skin: Skin is warm and dry. No rash noted.    ED Course  Procedures (including critical care time)  Labs Reviewed  URINALYSIS, ROUTINE W REFLEX MICROSCOPIC - Abnormal; Notable for the following:    Color, Urine AMBER (*)  BIOCHEMICALS MAY BE AFFECTED BY COLOR   APPearance CLOUDY (*)     Hgb urine dipstick LARGE (*)     Ketones, ur 40 (*)     Protein, ur 30 (*)     Leukocytes, UA TRACE (*)     All other components within normal limits  POCT I-STAT, CHEM 8 - Abnormal; Notable for the following:    Glucose, Bld 107 (*)     All other components within normal limits  URINE MICROSCOPIC-ADD ON   Ct Abdomen Pelvis Wo Contrast  12/17/2012  *RADIOLOGY REPORT*  Clinical Data: Acute onset of left upper quadrant abdominal pain, evaluate for renal stone  CT ABDOMEN AND PELVIS WITHOUT CONTRAST  Technique:  Multidetector CT imaging of the abdomen and pelvis was performed  following the standard protocol without intravenous contrast.  Comparison: The lumbar spine radiographs - 09/12/2009  Findings:  The lack of intravenous contrast limits the ability to evaluate solid abdominal organs.  There is an approximately 4 mm stone within the distal aspect of the left ureter (image 75, series 2 which results in a mild upstream ureterectasis and pyelocaliectasis and mildly asymmetric left-sided perinephric stranding.  No additional left-sided renal stones.  Note is made of two punctate (2 to 3 mm) nonobstructing stones within  the mid and inferior aspects of the right kidney.  Normal noncontrast appearance of the urinary bladder to the degree of distension.  Normal hepatic contour.  Mild diffuse decreased attenuation of the hepatic parenchyma with relative sparing adjacent to the gallbladder fossa.  Normal noncontrast appearance of the gallbladder.  No ascites.  Normal noncontrast appearance of the bilateral adrenal glands, pancreas and spleen.  Incidental note is made with a prominent splenule.  Colonic diverticulosis without evidence of diverticulitis on this noncontrast examination.  Bowel is otherwise normal in course and caliber without wall thickening or evidence of obstruction.  Normal noncontrast appearance of the appendix.  No pneumoperitoneum, pneumatosis or portal venous gas.  Scattered minimal atherosclerotic plaque within normal caliber abdominal aorta.  Scattered shoddy retroperitoneal lymph nodes are not enlarged by CT criteria.  No definite retroperitoneal, mesenteric, pelvic or inguinal lymphadenopathy.  Incidental note is made of a small to moderate-sized indirect left-sided mesenteric fat containing inguinal hernia.  Limited visualization of the lower thorax demonstrates minimal bibasilar opacities favored to represent atelectasis.  No focal air space opacities.  No pleural effusion.  Normal heart size.  No pericardial effusion.  Age indeterminate mild (approximately 25%) focal and cavity involving the superior endplate of the L4 vertebral body.  Mild to moderate multilevel lumbar spine degenerative change, worst at L3 - L4 and L5 - S1.  IMPRESSION:  1.  Approximately 4 mm stone within the distal aspect of the left ureter results in mild upstream ureterectasis and pelvocaliectasis. No additional left-sided renal stones. 2.  Two punctate (2-3 mm) nonobstructing right-sided renal stones. No evidence of right-sided urinary obstruction. 3.  Suspected hepatic steatosis. 4.  Colonic diverticulosis. 5.  Small to moderate size  left-sided indirect mesenteric fat containing hernia. 6.  Age indeterminate (approximate 25%) focal narrowing involving the superior endplate of the L4 vertebral body.  Correlation point tenderness at this location is recommended. 7.  Mild to moderate multilevel lumbar spine degenerative change.   Original Report Authenticated By: Tacey Ruiz, MD      1. Ureteral stone   2. Kidney stone     Room air saturation is 100% I interpret this to be normal.  10:29 AM Pain is improved and CT scan which I reviewed myself and review the radiologist's interpretation shows a 4 mm distal left ureteral stone consistent with his symptoms and my pretest suspicion. Patient likely will still be able to pass the stone, will be given prescriptions for analgesics, antiemetics and given referral to urologist as needed.  I suspect initial elevated BP was due to anxiety, severe pain, and chronic history.  Should improve with symtpom control.     MDM   Patient with symptoms and history highly suggestive of ureteral colic from a kidney stone. Plan is to check an i-STAT 8 for his creatinine as well as obtain a noncontrast CT to assess for ureteral stone. IV Toradol, Dilaudid and Zofran were administered for her symptoms.  Gavin Pound. Pura Picinich, MD 12/17/12 1057

## 2012-12-17 NOTE — ED Notes (Signed)
Pt states he has a ride home

## 2012-12-17 NOTE — ED Notes (Signed)
Pt. Given crackers and water  

## 2012-12-17 NOTE — ED Notes (Signed)
Pt. Stated, I've been given BP med before and I just don't take them.

## 2012-12-29 ENCOUNTER — Encounter (HOSPITAL_COMMUNITY): Payer: Self-pay | Admitting: Emergency Medicine

## 2012-12-29 ENCOUNTER — Emergency Department (HOSPITAL_COMMUNITY)
Admission: EM | Admit: 2012-12-29 | Discharge: 2012-12-29 | Discharge status: Against Medical Advice | Payer: Medicaid Other | Attending: Emergency Medicine | Admitting: Emergency Medicine

## 2012-12-29 DIAGNOSIS — R197 Diarrhea, unspecified: Secondary | ICD-10-CM | POA: Insufficient documentation

## 2012-12-29 DIAGNOSIS — I10 Essential (primary) hypertension: Secondary | ICD-10-CM | POA: Insufficient documentation

## 2012-12-29 DIAGNOSIS — R109 Unspecified abdominal pain: Secondary | ICD-10-CM | POA: Insufficient documentation

## 2012-12-29 DIAGNOSIS — F17200 Nicotine dependence, unspecified, uncomplicated: Secondary | ICD-10-CM | POA: Insufficient documentation

## 2012-12-29 DIAGNOSIS — R300 Dysuria: Secondary | ICD-10-CM | POA: Insufficient documentation

## 2012-12-29 DIAGNOSIS — R110 Nausea: Secondary | ICD-10-CM | POA: Insufficient documentation

## 2012-12-29 DIAGNOSIS — R52 Pain, unspecified: Secondary | ICD-10-CM | POA: Insufficient documentation

## 2012-12-29 DIAGNOSIS — R51 Headache: Secondary | ICD-10-CM | POA: Insufficient documentation

## 2012-12-29 LAB — URINALYSIS, ROUTINE W REFLEX MICROSCOPIC
Ketones, ur: NEGATIVE mg/dL
Leukocytes, UA: NEGATIVE
Nitrite: NEGATIVE
Specific Gravity, Urine: 1.011 (ref 1.005–1.030)
pH: 6 (ref 5.0–8.0)

## 2012-12-29 NOTE — ED Notes (Signed)
Onset 2-3 weeks seen here for flank pain diagnosed with Kidney stones. States pain now LLQ abdominal pain and right flank pain.  Pain currently 9/10 achy sharp with nausea, green diarrhea, and dysuria burning.  Headache 10/10 throbbing with light sensitivity.

## 2012-12-29 NOTE — ED Notes (Signed)
Called for room,  No answer in lobby.

## 2012-12-29 NOTE — ED Notes (Signed)
Pt called for room and no answer in lobby

## 2013-07-26 ENCOUNTER — Encounter (HOSPITAL_COMMUNITY): Payer: Self-pay | Admitting: Emergency Medicine

## 2013-07-26 ENCOUNTER — Emergency Department (HOSPITAL_COMMUNITY)
Admission: EM | Admit: 2013-07-26 | Discharge: 2013-07-26 | Disposition: A | Discharge status: Home / Self Care | Payer: Medicaid Other | Attending: Emergency Medicine | Admitting: Emergency Medicine

## 2013-07-26 ENCOUNTER — Emergency Department (HOSPITAL_COMMUNITY): Payer: Medicaid Other

## 2013-07-26 DIAGNOSIS — M79671 Pain in right foot: Secondary | ICD-10-CM

## 2013-07-26 DIAGNOSIS — G8929 Other chronic pain: Secondary | ICD-10-CM | POA: Insufficient documentation

## 2013-07-26 DIAGNOSIS — M79609 Pain in unspecified limb: Secondary | ICD-10-CM | POA: Insufficient documentation

## 2013-07-26 DIAGNOSIS — F17200 Nicotine dependence, unspecified, uncomplicated: Secondary | ICD-10-CM | POA: Insufficient documentation

## 2013-07-26 DIAGNOSIS — Z79899 Other long term (current) drug therapy: Secondary | ICD-10-CM | POA: Insufficient documentation

## 2013-07-26 DIAGNOSIS — I10 Essential (primary) hypertension: Secondary | ICD-10-CM | POA: Insufficient documentation

## 2013-07-26 DIAGNOSIS — K219 Gastro-esophageal reflux disease without esophagitis: Secondary | ICD-10-CM | POA: Insufficient documentation

## 2013-07-26 HISTORY — DX: Dizziness and giddiness: R42

## 2013-07-26 MED ORDER — ATENOLOL 50 MG PO TABS: 50.0000 mg | ORAL_TABLET | Freq: Every day | ORAL | Status: DC

## 2013-07-26 MED ORDER — TRAMADOL HCL 50 MG PO TABS: 50.0000 mg | ORAL_TABLET | Freq: Four times a day (QID) | ORAL | Status: DC | PRN

## 2013-07-26 NOTE — Progress Notes (Deleted)
   CARE MANAGEMENT ED NOTE 07/26/2013  Patient:  Shawn Travis   Account Number:  [id number]  Date Initiated:  07/26/2013  Documentation initiated by:  Fransico Michael  Subjective/Objective Assessment:   presented to ED with c/o swollen arm     Subjective/Objective Assessment Detail:     Action/Plan:   medication assistance   Action/Plan Detail:   Anticipated DC Date:  07/26/2013     Status Recommendation to Physician:   Result of Recommendation:      DC Planning Services  CM consult  MATCH Program  [facility] / [facility] (established/new)    Choice offered to / List presented to:            Status of service:  Completed, signed off  ED Comments:   ED Comments Detail:  07/26/13-1343-J.Nigeria Lasseter,RN,BSN [phone]      Received call from patient requesting assistance with doxycycline cost. States, " I just can't afford that. I have a job but ain't got no insurance". Explained MATCH program to patient who voiced interest. Assessed for elligibility, and enrolled in [facility]. Letter printed. Explained again to patient that each medication filled thru [facility] has a $3 copay and the program can only be utilized one time. Voiced understanding. Felicia with [facility] also spoke with patient. MATCH letter and [facility] information left for patient at [facility]. No further needs identified.

## 2013-07-26 NOTE — ED Notes (Addendum)
States has lump on bottom of  Rt foot and his 2nd and 3rd toe are growning funny  For 3 weeks. Hurts to walk  Denies injury and states he cannot afford bp meds

## 2013-07-26 NOTE — ED Provider Notes (Signed)
CSN: [id number]     Arrival date & time 07/26/13  1348 History   This chart was scribed for non-physician practitioner Georgeanna Harrison, working with Glynn Octave, MD by Dorothey Baseman, ED Scribe. This patient was seen in room TR09C/TR09C and the patient's care was started at 3:41 PM.   Chief Complaint  Patient presents with  . Foot Pain   The history is provided by the patient. No language interpreter was used.   HPI Comments: Shawn Travis is a 52 y.o. male who presents to the Emergency Department complaining of right foot pain onset about 3 weeks ago with an associated mass on the bottom of his foot that he states is painful, exacerbated when walking. He reports that he taped his toes together at home because he thought he might have broken his toe, but states that when he removed the tape he believes his toes may be drifting apart. Patient reports a history of HTN that was treated with Atenolol, but states that he has not taken his medications or followed up with a PCP for about 1 year due to the office shutting down and being unable to afford his medications. He states he has used Ibuprofen, heat, and ice at home without relief. He denies any chest pain, shortness of breath, headache, or any other symptoms. Patient is a current every day smoker, 2-3 PPD.  Past Medical History  Diagnosis Date  . Hypertension   . Chronic back pain   . Acid reflux   . Vertigo    History reviewed. No pertinent past surgical history. No family history on file. History  Substance Use Topics  . Smoking status: Current Every Day Smoker -- 2.00 packs/day  . Smokeless tobacco: Not on file  . Alcohol Use: No    Review of Systems  A complete 10 system review of systems was obtained and all systems are negative except as noted in the HPI and PMH.   Allergies  Hydrochlorothiazide  Home Medications   Current Outpatient Rx  Name  Route  Sig  Dispense  Refill  . Acetaminophen (TYLENOL PO)   Oral    Take 3-4 tablets by mouth every 8 (eight) hours as needed (pain).         . Ibuprofen (MOTRIN PO)   Oral   Take 2 tablets by mouth 2 (two) times daily as needed (pain).         . Omeprazole (PRILOSEC PO)   Oral   Take 1 capsule by mouth 2 (two) times daily as needed (acid reflux).          Triage Vitals: BP 185/124  Pulse 82  Temp(Src) 97.7 F (36.5 C) (Oral)  Resp 16  SpO2 98%  Physical Exam  Nursing note and vitals reviewed. Constitutional: He is oriented to person, place, and time. He appears well-developed and well-nourished. No distress.  HENT:  Head: Normocephalic and atraumatic.  Eyes: Conjunctivae are normal.  Neck: Normal range of motion. Neck supple.  Cardiovascular: Normal rate, regular rhythm and normal heart sounds.   2+ DP pulse bilaterally.  Pulmonary/Chest: Effort normal and breath sounds normal. No respiratory distress.  Musculoskeletal: Normal range of motion.  Tenderness to palpation to the 2nd MTP of the right foot.  Neurological: He is alert and oriented to person, place, and time. No sensory deficit.  Skin: Skin is warm and dry. No erythema.  No breakage of the skin, erythema, warmth, or edema. Mild soft tissue swelling to the bottom of  the right foot, proximal to the 2nd and 3rd digits. No obvious mass palpated.   Psychiatric: He has a normal mood and affect. His behavior is normal.    ED Course  Procedures (including critical care time)  DIAGNOSTIC STUDIES: Oxygen Saturation is 98% on room air, normal by my interpretation.    COORDINATION OF CARE: 3:34PM- Advised patient to follow up with a PCP that was reccommended while in the ED for his HTN and discussed possible risk factors. Will order an x-ray of the right foot. Discussed treatment plan with patient at bedside and patient verbalized agreement.   Labs Review Labs Reviewed - No data to display  Imaging Review Dg Foot Complete Right  07/26/2013   *RADIOLOGY REPORT*  Clinical Data:  Right foot pain and swelling for 3-4 weeks, no known injury, pain primarily located in the area of the second and third metatarsals  RIGHT FOOT COMPLETE - 3+ VIEW  Comparison: Right ankle radiographs - 07/01/2012  Findings:  No acute fracture or dislocation. Redemonstrated is a discrete osseous structure adjacent to the tip of the fibula favored to be the sequelae of remote injury.  There is suspected mild degenerative change of the tibial-talar joint with joint space loss, articular surface irregularity and osteophytosis. No ankle joint effusion.  The remaining joint spaces are preserved.  No definite erosions.  Regional soft tissues are normal.  No radiopaque foreign body.  No plantar calcaneal spur.  Incidental note made of an os trigonum.  IMPRESSION:  1.  No acute findings with special attention paid to the second and third metatarsals. 2.  Sequela of prior injury to the distal fibula. 3.  Suspected mild degenerative change of the tibiotalar joint.   Original Report Authenticated By: Tacey Ruiz, MD    MDM  No diagnosis found. Patient presenting with foot pain and mass of foot.  No mass palpated on physical exam.  No acute findings on xray.  No signs of infection.  Patient is stable for discharge.  Patient instructed to follow up with PCP.  Patient also found to have elevated blood pressure.  He reports that he had been on Atenolol in the past, but is currently not taking the medication.  Patient explained the risks of HTN and explained the importance of taking his antihypertensive medication as prescribed.  Patient given prescription for Atenolol at the dose he had been on in the past.  Patient instructed to follow up with PCP.  Return precautions given.  I personally performed the services described in this documentation, which was scribed in my presence. The recorded information has been reviewed and is accurate.    Pascal Lux [place], PA-C 08/01/13 1001

## 2013-08-01 NOTE — ED Provider Notes (Signed)
Medical screening examination/treatment/procedure(s) were performed by non-physician practitioner and as supervising physician I was immediately available for consultation/collaboration.   Rishik Tubby, MD 08/01/13 1848 

## 2013-10-23 ENCOUNTER — Emergency Department (HOSPITAL_COMMUNITY)
Admission: EM | Admit: 2013-10-23 | Discharge: 2013-10-23 | Disposition: A | Discharge status: Home / Self Care | Payer: Medicaid Other | Attending: Emergency Medicine | Admitting: Emergency Medicine

## 2013-10-23 ENCOUNTER — Emergency Department (HOSPITAL_COMMUNITY): Payer: Medicaid Other

## 2013-10-23 ENCOUNTER — Encounter (HOSPITAL_COMMUNITY): Payer: Self-pay | Admitting: Emergency Medicine

## 2013-10-23 DIAGNOSIS — I10 Essential (primary) hypertension: Secondary | ICD-10-CM | POA: Insufficient documentation

## 2013-10-23 DIAGNOSIS — F17200 Nicotine dependence, unspecified, uncomplicated: Secondary | ICD-10-CM | POA: Insufficient documentation

## 2013-10-23 DIAGNOSIS — R109 Unspecified abdominal pain: Secondary | ICD-10-CM

## 2013-10-23 DIAGNOSIS — R300 Dysuria: Secondary | ICD-10-CM | POA: Insufficient documentation

## 2013-10-23 DIAGNOSIS — R1032 Left lower quadrant pain: Secondary | ICD-10-CM | POA: Insufficient documentation

## 2013-10-23 DIAGNOSIS — R110 Nausea: Secondary | ICD-10-CM | POA: Insufficient documentation

## 2013-10-23 DIAGNOSIS — K219 Gastro-esophageal reflux disease without esophagitis: Secondary | ICD-10-CM | POA: Insufficient documentation

## 2013-10-23 DIAGNOSIS — Z87442 Personal history of urinary calculi: Secondary | ICD-10-CM | POA: Insufficient documentation

## 2013-10-23 DIAGNOSIS — Z79899 Other long term (current) drug therapy: Secondary | ICD-10-CM | POA: Insufficient documentation

## 2013-10-23 DIAGNOSIS — G8929 Other chronic pain: Secondary | ICD-10-CM | POA: Insufficient documentation

## 2013-10-23 LAB — BASIC METABOLIC PANEL
BUN: 9 mg/dL (ref 6–23)
Chloride: 103 mEq/L (ref 96–112)
GFR calc Af Amer: >90 mL/min (ref >90)
Potassium: 4.3 mEq/L (ref 3.5–5.1)

## 2013-10-23 LAB — CBC WITH DIFFERENTIAL/PLATELET
Basophils Relative: 0 % (ref 0–1)
Hemoglobin: 15.8 g/dL (ref 13.0–17.0)
MCHC: 35.5 g/dL (ref 30.0–36.0)
Monocytes Relative: 10 % (ref 3–12)
Neutro Abs: 4.5 10*3/uL (ref 1.7–7.7)
Neutrophils Relative %: 58 % (ref 43–77)
RBC: 5.3 MIL/uL (ref 4.22–5.81)
WBC: 7.8 10*3/uL (ref 4.0–10.5)

## 2013-10-23 LAB — URINALYSIS, ROUTINE W REFLEX MICROSCOPIC
Bilirubin Urine: NEGATIVE
Leukocytes, UA: NEGATIVE
Nitrite: NEGATIVE
Specific Gravity, Urine: 1.018 (ref 1.005–1.030)
pH: 6.5 (ref 5.0–8.0)

## 2013-10-23 IMAGING — CR DG ANKLE COMPLETE 3+V*R*
3 series · 3 of 3 positions shown · non-contrast
Comparison: None.

CLINICAL DATA: Trauma 3 months ago.  Chronic pain since.

RIGHT ANKLE - COMPLETE 3+ VIEW

[t ankle joint ap right]
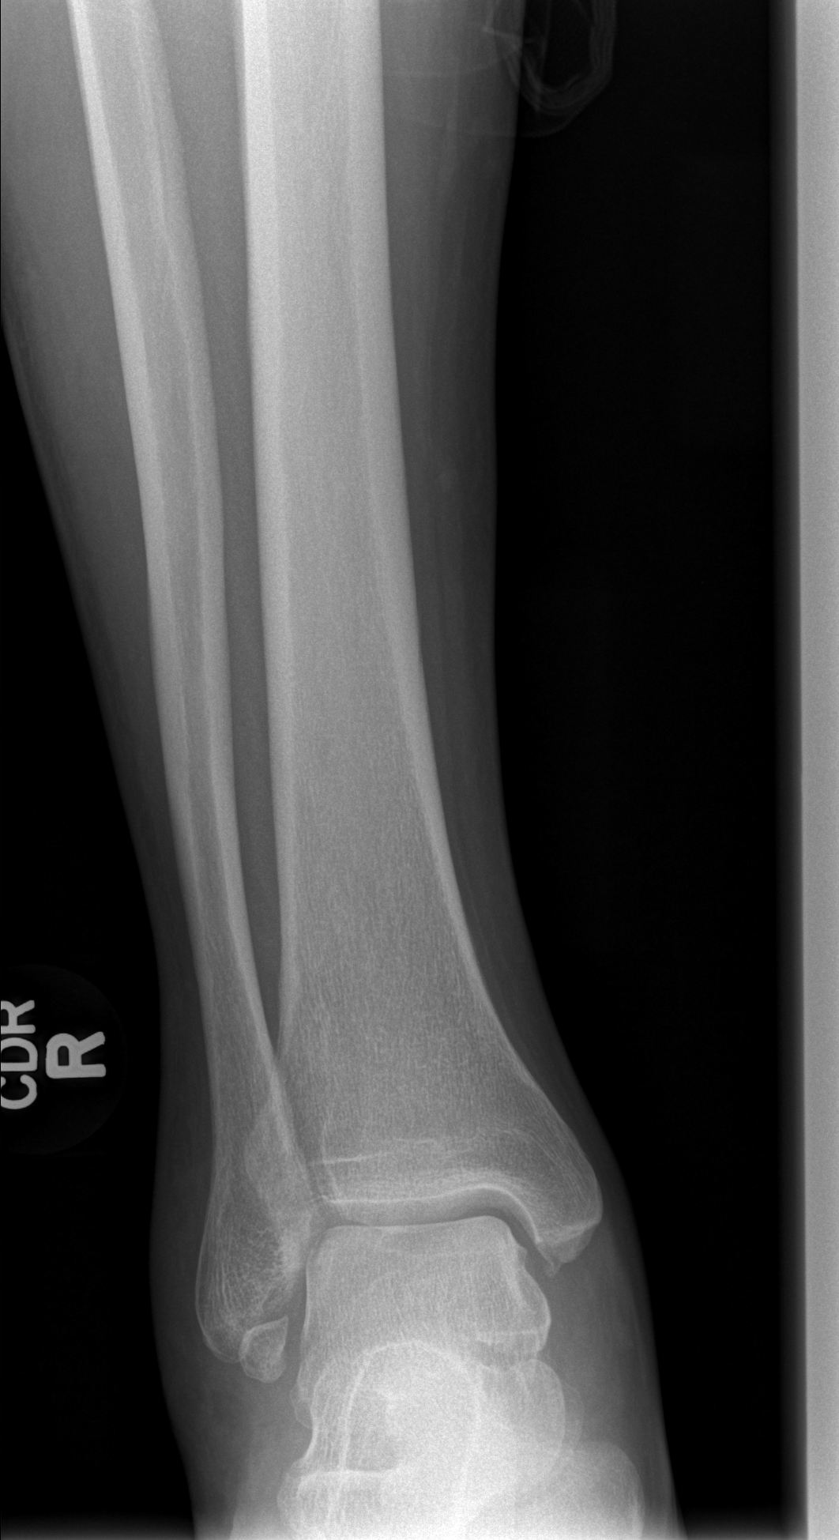

[t ankle joint oblique right]
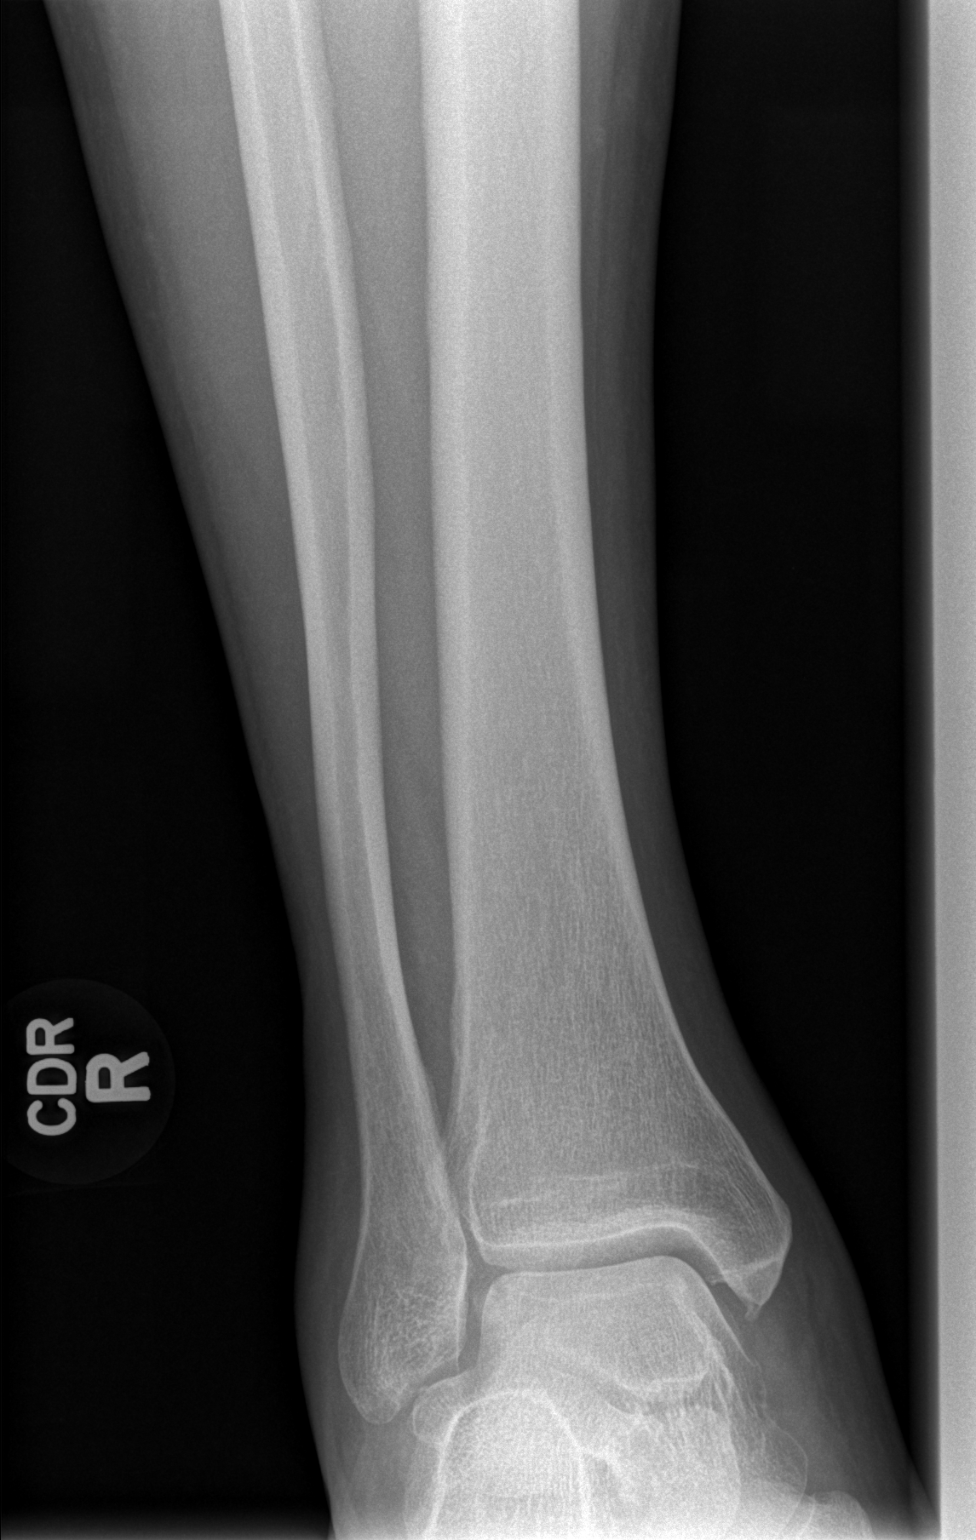

[t ankle joint lat right]
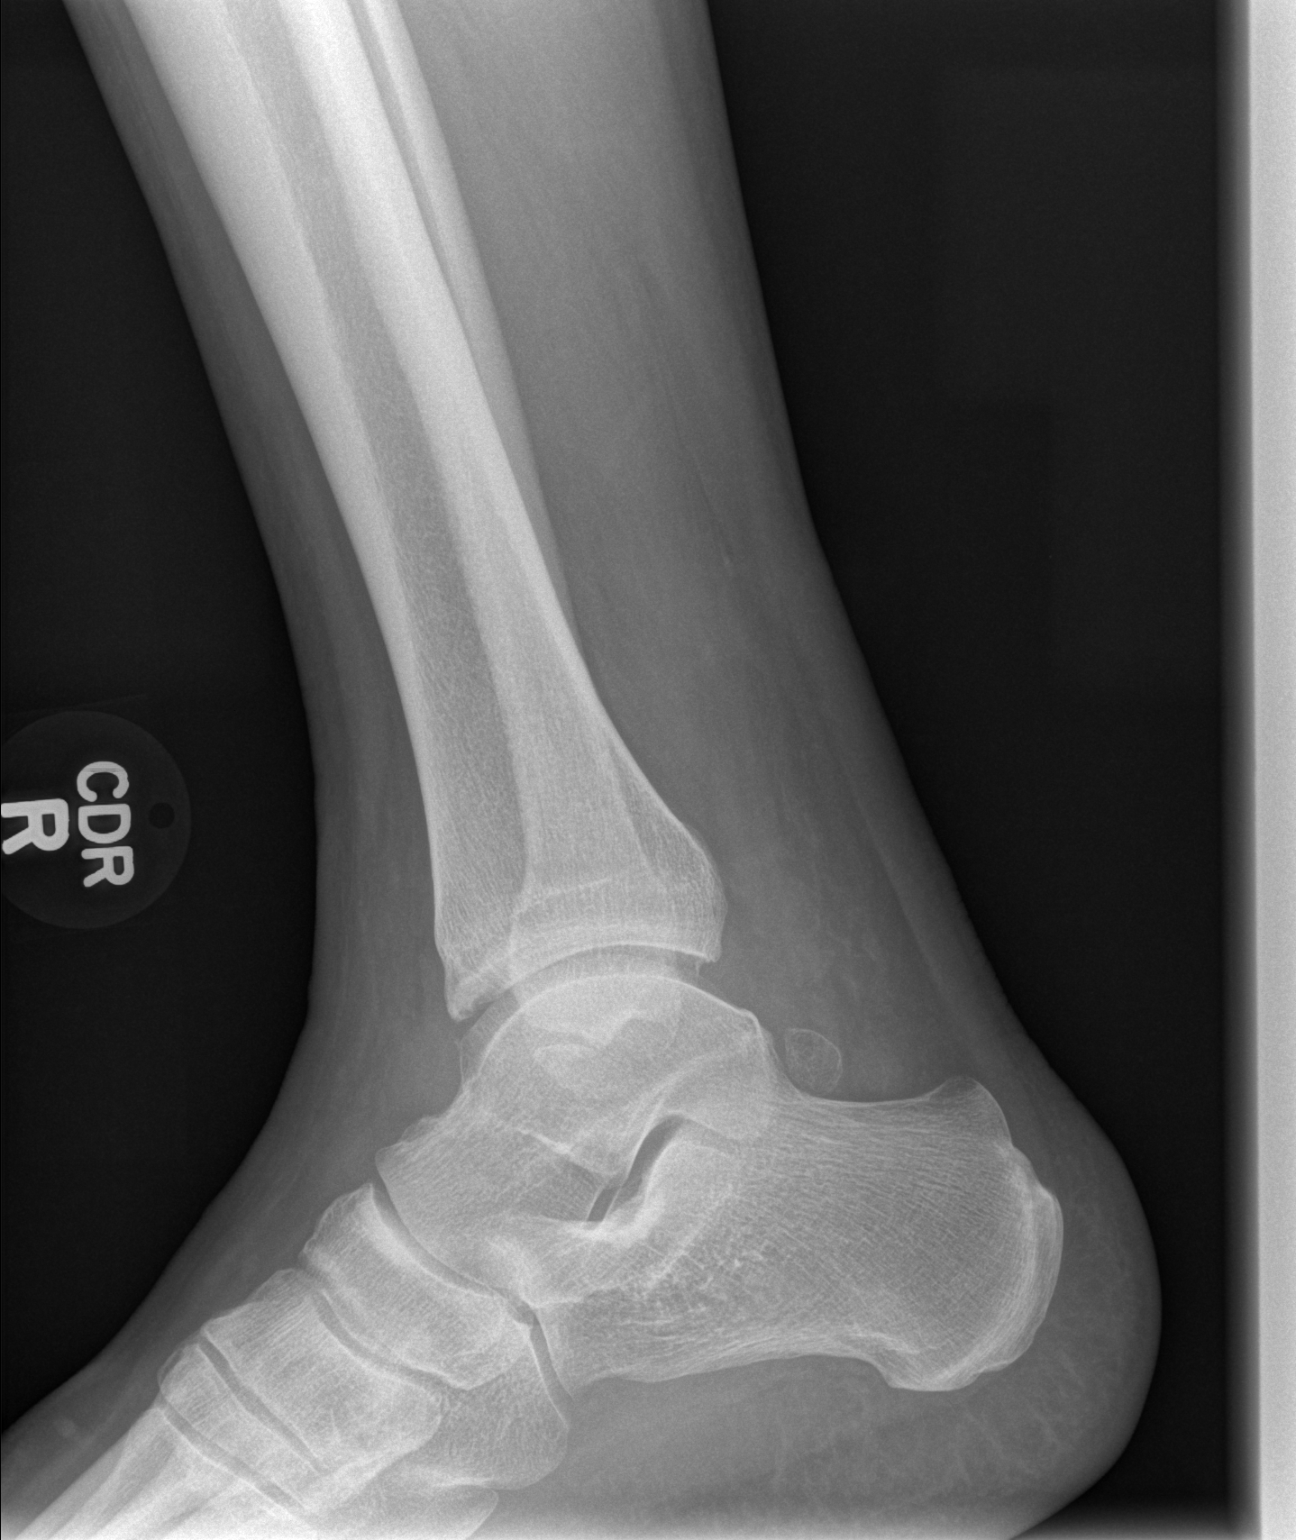

[3 of 3 positions shown; findings below may reference images not displayed]

FINDINGS: There is lateral malleolar soft tissue swelling.  A well-
corticated ossific fragment is identified distal to the lateral
malleolus.  No definite donor site.  There is also an os trigonum.
Mild tibiotalar osteoarthritis.
IMPRESSION: Lateral soft tissue swelling with ossific fragment distal to the
lateral malleolus.  Favored to be related to remote trauma.  No
evidence of acute fracture.

Mild tibiotalar osteoarthritis.

## 2013-10-23 MED ORDER — NAPROXEN 500 MG PO TABS: 500.0000 mg | ORAL_TABLET | Freq: Two times a day (BID) | ORAL | Status: DC

## 2013-10-23 MED ORDER — ONDANSETRON HCL 4 MG/2ML IJ SOLN
4.0000 mg | Freq: Once | INTRAMUSCULAR | Status: AC
  Administered 2013-10-23: 4 mg via INTRAVENOUS
  Filled 2013-10-23: qty 2

## 2013-10-23 MED ORDER — PROMETHAZINE HCL 25 MG PO TABS: 25.0000 mg | ORAL_TABLET | Freq: Four times a day (QID) | ORAL | Status: DC | PRN

## 2013-10-23 MED ORDER — KETOROLAC TROMETHAMINE 30 MG/ML IJ SOLN
30.0000 mg | Freq: Once | INTRAMUSCULAR | Status: AC
  Administered 2013-10-23: 30 mg via INTRAVENOUS
  Filled 2013-10-23: qty 1

## 2013-10-23 MED ORDER — SODIUM CHLORIDE 0.9 % IV BOLUS (SEPSIS)
1000.0000 mL | Freq: Once | INTRAVENOUS | Status: AC
  Administered 2013-10-23: 1000 mL via INTRAVENOUS

## 2013-10-23 NOTE — ED Notes (Signed)
Pt states that he has a hx of kidney stones. Pt complains of pain on the left side in the lower abdomen.

## 2013-10-23 NOTE — ED Notes (Signed)
Pt denies blood in urine. Pt states with his last kidney note there was a lot of blood.

## 2013-10-23 NOTE — ED Provider Notes (Signed)
CSN: [id number]     Arrival date & time 10/23/13  0740 History   First MD Initiated Contact with Patient 10/23/13 0745     Chief Complaint  Patient presents with  . Abdominal Pain    Hx of Kidney Stones   (Consider location/radiation/quality/duration/timing/severity/associated sxs/prior Treatment) HPI Comments: Patient is a 52 year old male with a past medical history of kidney stones and hypertension who presents with abdominal pain since last night. The pain is located in his LLQ and radiates down to his groin. The pain is described as aching and severe. The pain started gradually and progressively worsened since the onset. No alleviating/aggravating factors. The patient has tried nothing for symptoms without relief. Associated symptoms include nausea and difficulty urinating. Patient denies fever, headache, NVD, chest pain, SOB, constipation. Patient reports this feels similar to his previous kidney stones.      Past Medical History  Diagnosis Date  . Hypertension   . Chronic back pain   . Acid reflux   . Vertigo    No past surgical history on file. No family history on file. History  Substance Use Topics  . Smoking status: Current Every Day Smoker -- 1.00 packs/day  . Smokeless tobacco: Not on file  . Alcohol Use: No    Review of Systems  Constitutional: Negative for fever, chills and fatigue.  HENT: Negative for trouble swallowing.   Eyes: Negative for visual disturbance.  Respiratory: Negative for shortness of breath.   Cardiovascular: Negative for chest pain and palpitations.  Gastrointestinal: Positive for nausea and abdominal pain. Negative for vomiting and diarrhea.  Genitourinary: Positive for difficulty urinating. Negative for dysuria.  Musculoskeletal: Negative for arthralgias and neck pain.  Skin: Negative for color change.  Neurological: Negative for dizziness and weakness.  Psychiatric/Behavioral: Negative for dysphoric mood.    Allergies   Hydrochlorothiazide  Home Medications   Current Outpatient Rx  Name  Route  Sig  Dispense  Refill  . omeprazole (PRILOSEC OTC) 20 MG tablet   Oral   Take 20 mg by mouth as needed (for acid reflux).          BP 181/123  Pulse 78  Resp 22  SpO2 95% Physical Exam  Nursing note and vitals reviewed. Constitutional: He is oriented to person, place, and time. He appears well-developed and well-nourished. No distress.  HENT:  Head: Normocephalic and atraumatic.  Eyes: Conjunctivae and EOM are normal.  Neck: Normal range of motion.  Cardiovascular: Normal rate and regular rhythm.  Exam reveals no gallop and no friction rub.   No murmur heard. Pulmonary/Chest: Effort normal and breath sounds normal. He has no wheezes. He has no rales. He exhibits no tenderness.  Abdominal: Soft. He exhibits no distension. There is tenderness. There is no rebound and no guarding.  LLQ tenderness to palpation. No other focal tenderness to palpation or peritoneal signs.   Genitourinary:  No CVA tenderness.   Musculoskeletal: Normal range of motion.  Neurological: He is alert and oriented to person, place, and time. Coordination normal.  Speech is goal-oriented. Moves limbs without ataxia.   Skin: Skin is warm and dry.  Psychiatric: He has a normal mood and affect. His behavior is normal.    ED Course  Procedures (including critical care time) Labs Review Labs Reviewed  BASIC METABOLIC PANEL - Abnormal; Notable for the following:    Glucose, Bld 100 (*)    All other components within normal limits  URINE CULTURE  CBC WITH DIFFERENTIAL  URINALYSIS,  ROUTINE W REFLEX MICROSCOPIC   Imaging Review Ct Abdomen Pelvis Wo Contrast  10/23/2013   CLINICAL DATA:  Left groin pain.  History of renal calculi.  EXAM: CT ABDOMEN AND PELVIS WITHOUT CONTRAST  TECHNIQUE: Multidetector CT imaging of the abdomen and pelvis was performed following the standard protocol without intravenous contrast.  COMPARISON:   12/17/2012  FINDINGS: The noncontrast CT appearance of the liver, spleen, pancreas, and adrenal glands is within normal limits. Gallbladder mildly contracted but otherwise unremarkable.  There is a 2 mm right mid kidney calculus and a separate 3 mm right mid kidney calculus, without hydronephrosis or significant hydroureter, and without right ureteral calculus.  No left-sided calculus observed.  No pathologic upper abdominal adenopathy is observed. Appendix normal. Prominent stool throughout the colon favors constipation. No free pelvic fluid. Urinary bladder normal. Borderline prominent prostate gland at 4.5 x 3.0 cm.  Fatty left spermatic cord.  Facet arthropathy observed at L4-5 and L5-S1. Broad Schmorl's node or superior endplate compression at L4. Suspected foraminal impingement bilaterally at L5-S1 and potentially at L4-5.  IMPRESSION: 1. No left-sided calculus identified. 2.  Prominent stool throughout the colon favors constipation. 3. Fatty left spermatic cord (chronic). 4. Chronic nonobstructive right nephrolithiasis. 5. Lower lumbar facet arthropathy.   Electronically Signed   By: Herbie Baltimore M.D.   On: 10/23/2013 11:23    EKG Interpretation   None       MDM   1. Abdominal pain     9:00 AM Labs and urinalysis pending. Patient given toradol and zofran for symptoms. Vitals stable and patient afebrile.   11:46 AM Patient's pain is relieved by toradol. Patient is able to eat and drink without difficulty. Labs and urinalysis unremarkable. CT shows no acute changes. Patient will be discharged with pain and nausea medication. Patient will follow up with his PCP. Vitals stable and patient afebrile.   Emilia Beck, PA-C 10/23/13 1147

## 2013-10-24 LAB — URINE CULTURE: Colony Count: 4000

## 2013-10-26 NOTE — ED Provider Notes (Signed)
Medical screening examination/treatment/procedure(s) were conducted as a shared visit with non-physician practitioner(s) and myself.  I personally evaluated the patient during the encounter.  EKG Interpretation   None      Pt comes in with cc of abd pain, radiating to groin. CT confirms renal stones. Appropriate therapy started. Abd exam is non peritoneal.  Derwood Kaplan, MD 10/26/13 1610

## 2014-02-22 ENCOUNTER — Emergency Department (INDEPENDENT_AMBULATORY_CARE_PROVIDER_SITE_OTHER)
Admission: EM | Admit: 2014-02-22 | Discharge: 2014-02-22 | Disposition: A | Discharge status: Home / Self Care | Payer: Self-pay | Source: Home / Self Care | Attending: Emergency Medicine | Admitting: Emergency Medicine

## 2014-02-22 ENCOUNTER — Encounter (HOSPITAL_COMMUNITY): Payer: Self-pay | Admitting: Emergency Medicine

## 2014-02-22 DIAGNOSIS — Z8614 Personal history of Methicillin resistant Staphylococcus aureus infection: Secondary | ICD-10-CM

## 2014-02-22 DIAGNOSIS — L02219 Cutaneous abscess of trunk, unspecified: Secondary | ICD-10-CM

## 2014-02-22 DIAGNOSIS — I10 Essential (primary) hypertension: Secondary | ICD-10-CM

## 2014-02-22 DIAGNOSIS — L03319 Cellulitis of trunk, unspecified: Secondary | ICD-10-CM

## 2014-02-22 DIAGNOSIS — L02211 Cutaneous abscess of abdominal wall: Secondary | ICD-10-CM

## 2014-02-22 HISTORY — DX: Methicillin resistant Staphylococcus aureus infection, unspecified site: A49.02

## 2014-02-22 NOTE — ED Notes (Signed)
Patient walked out of treatment room saying he had to leave and could not wait.

## 2014-02-22 NOTE — ED Provider Notes (Signed)
CSN: [id number]     Arrival date & time 02/22/14  1812 History   First MD Initiated Contact with Patient 02/22/14 1909     Chief Complaint  Patient presents with  . Wound Infection    Patient is a 53 y.o. male presenting with abscess. The history is provided by the patient.  Abscess Location:  Torso Torso abscess location:  Abd LLQ Abscess quality: induration, painful, redness and warmth   Abscess quality: not draining, no fluctuance, no itching and not weeping   Red streaking: no   Progression:  Worsening Pain details:    Quality:  Aching and throbbing   Duration:  2 days   Timing:  Constant   Progression:  Worsening Chronicity:  Recurrent Context: not diabetes, not immunosuppression, not injected drug use, not insect bite/sting and not skin injury   Relieved by:  None tried Worsened by:  Nothing tried Ineffective treatments:  None tried Associated symptoms: fatigue   Associated symptoms: no anorexia, no fever, no headaches, no nausea and no vomiting   Risk factors: hx of MRSA and prior abscess   Risk factors: no family hx of MRSA   Pt reports he noted a small pimple like area to his lower abd (LLQ) on Wednesday that he initially thought was a bug bite. When he scratched it he noted that pus cam out. Pt states he has had MRSA in past. Has been hospitalized w/ MRSA infection in his nose during which he became quite ill. Since noticing the pimple and now it's worsening redness and pain he states he has felt increasing fatigue and body aches and fears he is "getting sick" w/ MRSA again.   Past Medical History  Diagnosis Date  . Hypertension   . Chronic back pain   . Acid reflux   . Vertigo   . MRSA infection    History reviewed. No pertinent past surgical history. No family history on file. History  Substance Use Topics  . Smoking status: Current Every Day Smoker -- 1.00 packs/day  . Smokeless tobacco: Not on file  . Alcohol Use: No    Review of Systems  Constitutional:  Positive for fatigue. Negative for fever.  HENT: Negative.   Eyes: Negative.   Cardiovascular: Negative.   Gastrointestinal: Negative.  Negative for nausea, vomiting and anorexia.  Endocrine: Negative.   Genitourinary: Negative.   Musculoskeletal: Positive for myalgias.  Skin: Positive for wound.  Allergic/Immunologic: Negative.   Neurological: Negative for headaches.  Hematological: Negative.   Psychiatric/Behavioral: Negative.     Allergies  Hydrochlorothiazide  Home Medications   Current Outpatient Rx  Name  Route  Sig  Dispense  Refill  . naproxen (NAPROSYN) 500 MG tablet   Oral   Take 1 tablet (500 mg total) by mouth 2 (two) times daily with a meal.   30 tablet   0   . omeprazole (PRILOSEC OTC) 20 MG tablet   Oral   Take 20 mg by mouth as needed (for acid reflux).         . promethazine (PHENERGAN) 25 MG tablet   Oral   Take 1 tablet (25 mg total) by mouth every 6 (six) hours as needed for nausea or vomiting.   12 tablet   0    BP 224/97  Pulse 78  Temp(Src) 98.3 F (36.8 C) (Oral)  Resp 16  SpO2 96% Physical Exam  Constitutional: He is oriented to person, place, and time. He appears well-developed and well-nourished.  HENT:  Head:  Normocephalic and atraumatic.  Eyes: Conjunctivae are normal.  Cardiovascular: Normal rate.   hypertensive  Pulmonary/Chest: Effort normal.  Musculoskeletal: Normal range of motion.  Neurological: He is alert and oriented to person, place, and time.  Skin: Skin is warm and dry.     Raised, firm, erythematous nodule noted to (L) lower abd approx 1 x 1.5 cm at the center w/ an approx 1.5 cm area of erythema at the wounds periphery. Nodule is non-fluctuant and non-draining.  Psychiatric: He has a normal mood and affect.    ED Course  Procedures (including critical care time) Labs Review Labs Reviewed - No data to display Imaging Review No results found.   MDM   1. Abscess of abdominal wall   2. HTN (hypertension)    3. History of MRSA infection    Pt had been examined and plan of care had been discussed and agreed upon by pt. Was asked to help a colleague w/ another pt for approx 5-10 mintes and when I went back to room RN reported pt had abruptly left the office stating he could not wait any longer and had to leave. He did not receive any d/c instructions or Rx's and was not available to have his BP rechecked. I have concern regarding this pt's decision to leave as he has been hospitalized in past for MRSA infection. I attempted to call pt on phone number given at the time of his check in and left a message on his voice mail. If he returns my call I would be willing to call in Rx for his antibiotic (Doxycycline) and encourage him to have his BP rechecked as soon as possible.                    Leanne ChangKatherine P Harland Aguiniga, NP 02/23/14 1220

## 2014-02-22 NOTE — ED Notes (Signed)
Patient denies pain and is resting comfortably.  

## 2014-02-22 NOTE — ED Notes (Signed)
Patient has a red, painful, warm area to left abdomen.  Patient reports a history of mrsa infections.  Noticed this area 3 days ago

## 2014-02-23 ENCOUNTER — Emergency Department (HOSPITAL_COMMUNITY)
Admission: EM | Admit: 2014-02-23 | Discharge: 2014-02-23 | Disposition: A | Discharge status: Home / Self Care | Payer: Medicaid Other | Source: Home / Self Care

## 2014-02-24 NOTE — ED Provider Notes (Signed)
Medical screening examination/treatment/procedure(s) were performed by non-physician practitioner and as supervising physician I was immediately available for consultation/collaboration.  Leslee Homeavid Chrisa Hassan, M.D.   Reuben Likesavid C Makhi Muzquiz, MD 02/24/14 [phone]

## 2014-04-10 IMAGING — CT CT ABD-PELV W/O CM
2 of 4 series · 16 of 46 positions shown, 18 images · non-contrast
Comparison: The lumbar spine radiographs - 09/12/2009

CLINICAL DATA: Acute onset of left upper quadrant abdominal pain,
evaluate for renal stone

CT ABDOMEN AND PELVIS WITHOUT CONTRAST
TECHNIQUE: Multidetector CT imaging of the abdomen and pelvis was
performed following the standard protocol without intravenous
contrast.

[Series 2: stone >200 lbs 5.0 b31f st · axial · 0.68mm/px · z∈[-456,-30]mm · 13 of 93 slices shown, 15 images]
[im 4/93  soft-tissue]
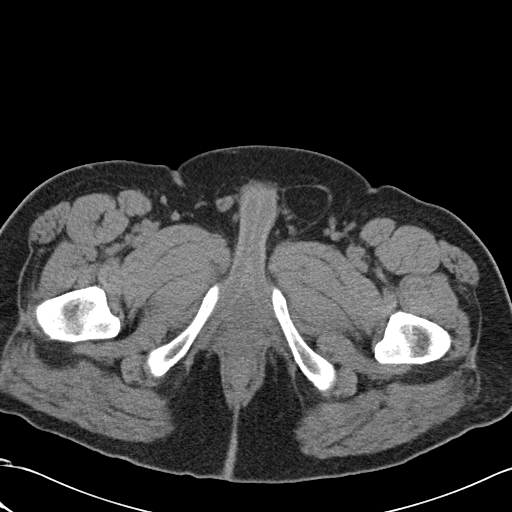
[im 4/93  bone]
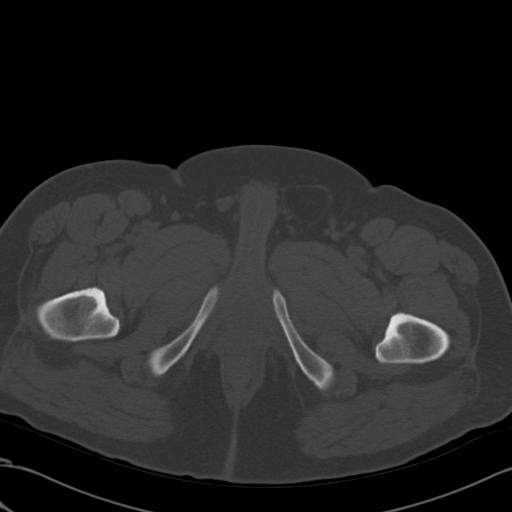
[im 11/93  soft-tissue]
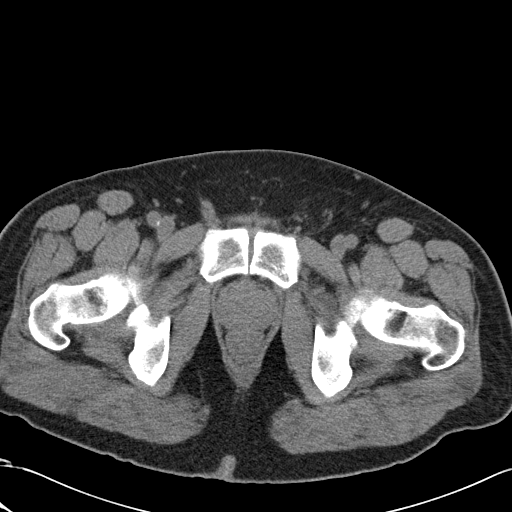
[im 18/93  soft-tissue]
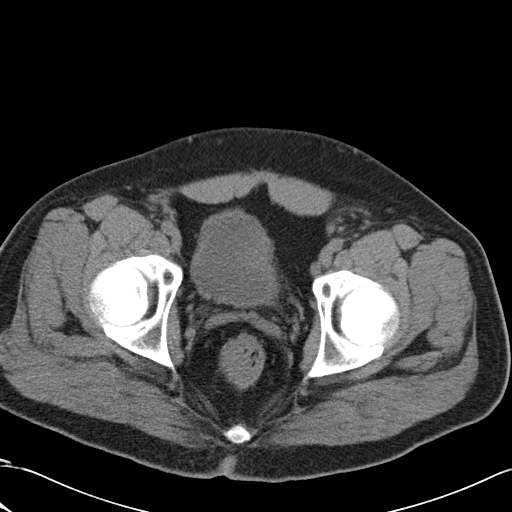
[im 25/93  soft-tissue]
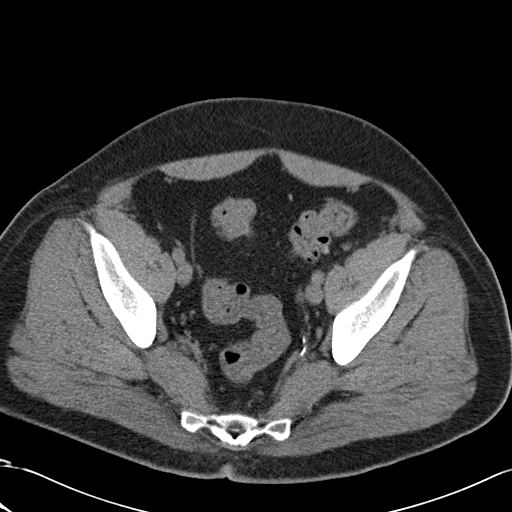
[im 32/93  soft-tissue]
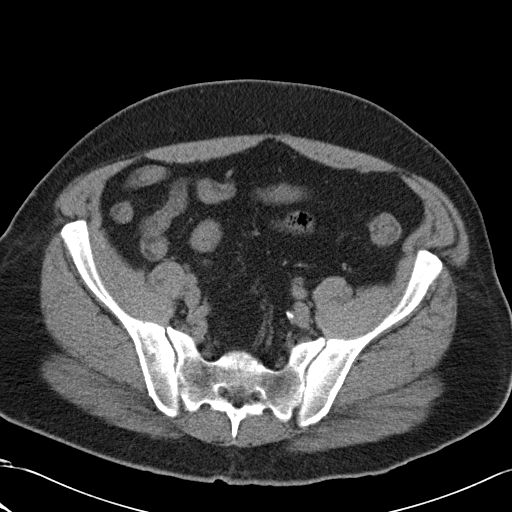
[im 39/93  soft-tissue]
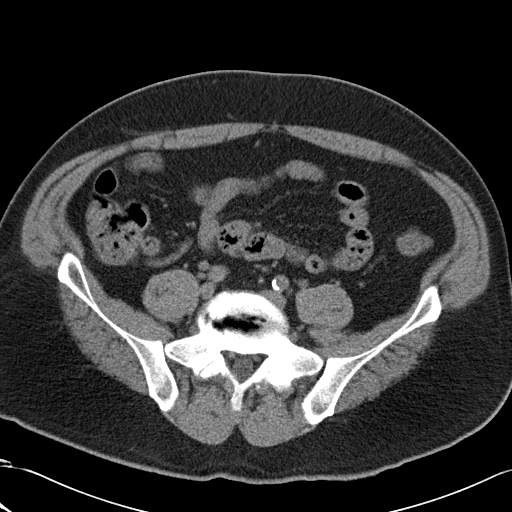
[im 47/93  soft-tissue]
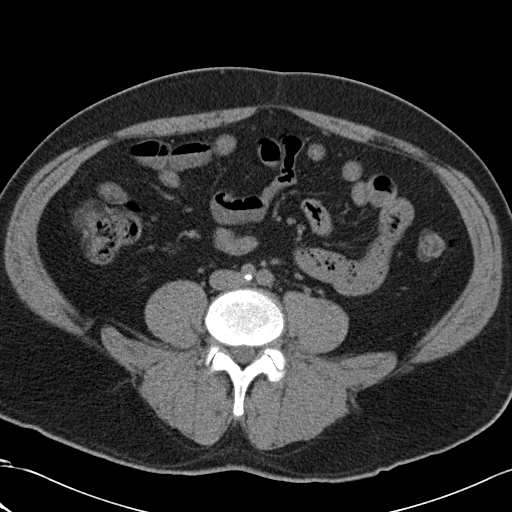
[im 54/93  soft-tissue]
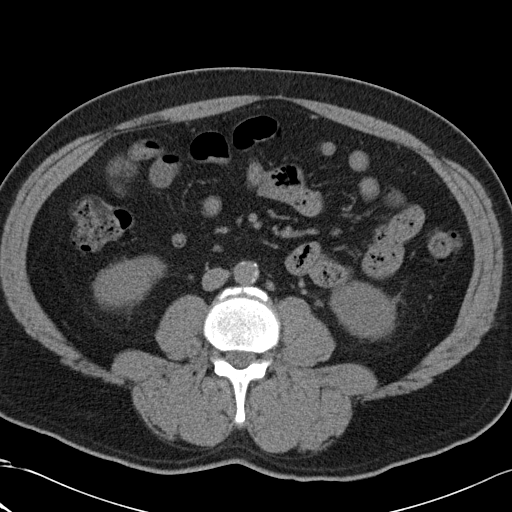
[im 61/93  soft-tissue]
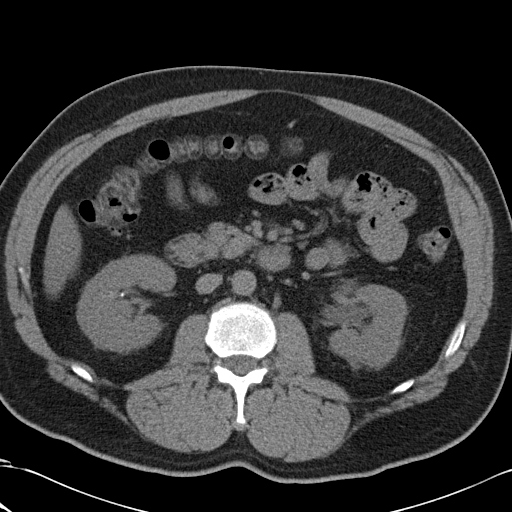
[im 61/93  bone]
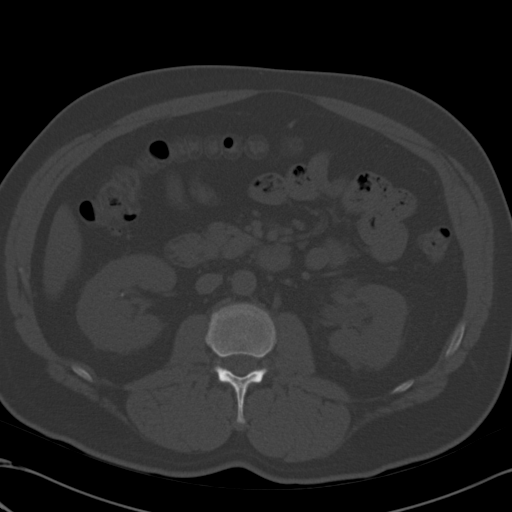
[im 68/93  soft-tissue]
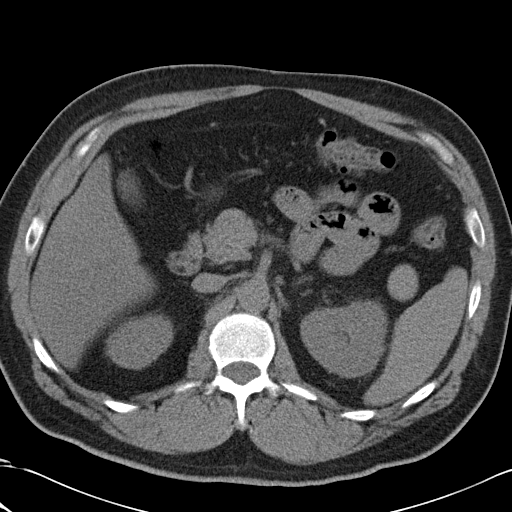
[im 75/93  soft-tissue]
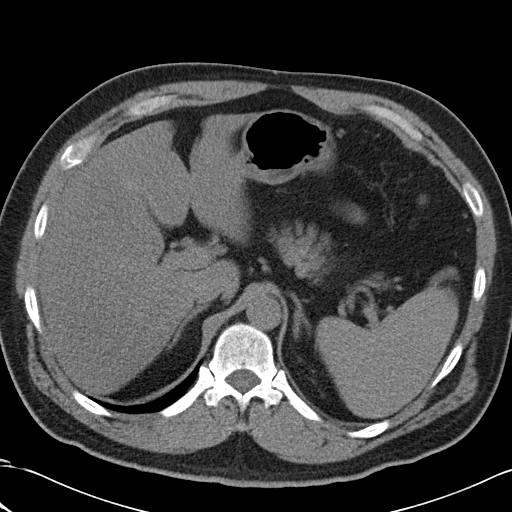
[im 82/93  soft-tissue]
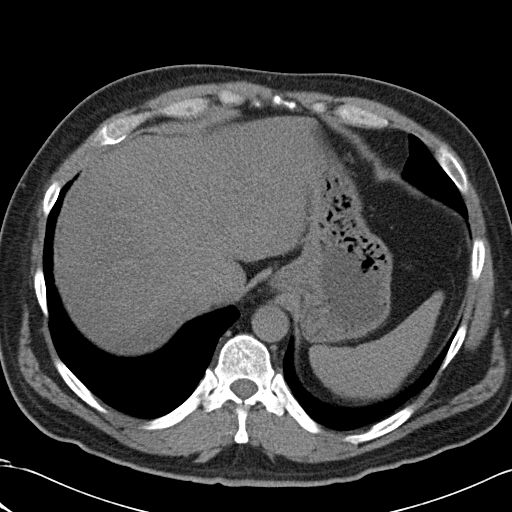
[im 89/93  soft-tissue]
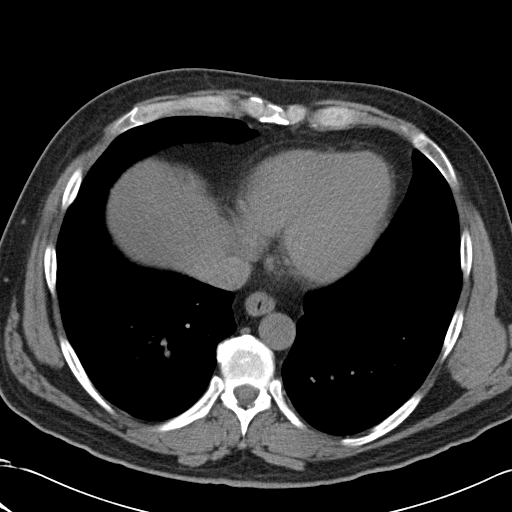

[Series 5: coronals · coronal · 0.91mm/px · 3 of 139 slices shown]
[im 47/139  soft-tissue]
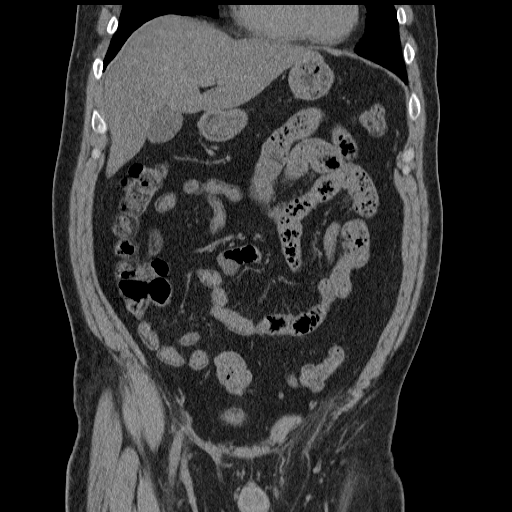
[im 62/139  soft-tissue]
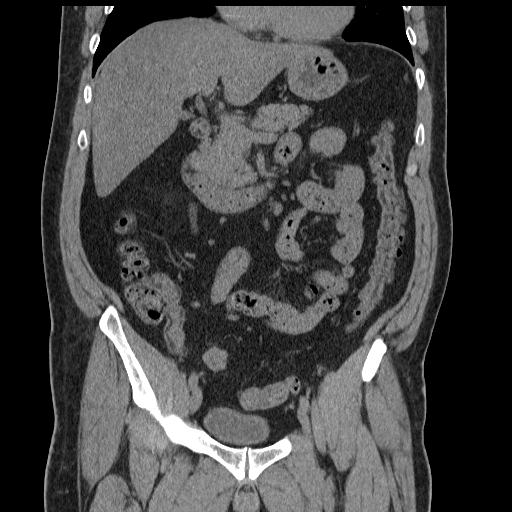
[im 77/139  soft-tissue]
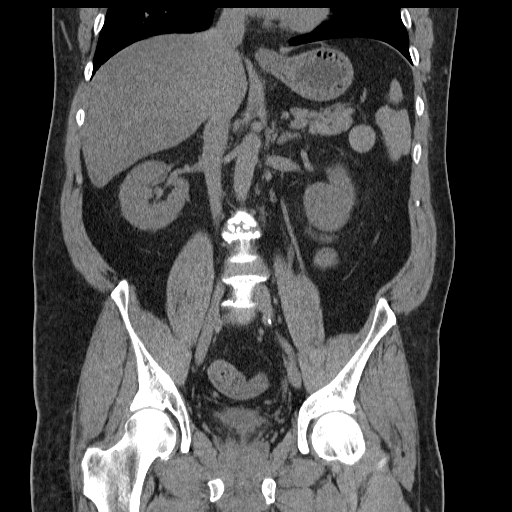

[16 of 46 positions shown; findings below may reference images not displayed]

FINDINGS: The lack of intravenous contrast limits the ability to evaluate
solid abdominal organs.

There is an approximately 4 mm stone within the distal aspect of
the left ureter (image 75, series 2 which results in a mild
upstream ureterectasis and pyelocaliectasis and mildly asymmetric
left-sided perinephric stranding.  No additional left-sided renal
stones.

Note is made of two punctate (2 to 3 mm) nonobstructing stones
within the mid and inferior aspects of the right kidney.  Normal
noncontrast appearance of the urinary bladder to the degree of
distension.

Normal hepatic contour.  Mild diffuse decreased attenuation of the
hepatic parenchyma with relative sparing adjacent to the
gallbladder fossa.  Normal noncontrast appearance of the
gallbladder.  No ascites.

Normal noncontrast appearance of the bilateral adrenal glands,
pancreas and spleen.  Incidental note is made with a prominent
splenule.

Colonic diverticulosis without evidence of diverticulitis on this
noncontrast examination.  Bowel is otherwise normal in course and
caliber without wall thickening or evidence of obstruction.  Normal
noncontrast appearance of the appendix.  No pneumoperitoneum,
pneumatosis or portal venous gas.

Scattered minimal atherosclerotic plaque within normal caliber
abdominal aorta.  Scattered shoddy retroperitoneal lymph nodes are
not enlarged by CT criteria.  No definite retroperitoneal,
mesenteric, pelvic or inguinal lymphadenopathy.  Incidental note is
made of a small to moderate-sized indirect left-sided mesenteric
fat containing inguinal hernia.

Limited visualization of the lower thorax demonstrates minimal
bibasilar opacities favored to represent atelectasis.  No focal air
space opacities.  No pleural effusion.

Normal heart size.  No pericardial effusion.

Age indeterminate mild (approximately 25%) focal and cavity
involving the superior endplate of the L4 vertebral body.  Mild to
moderate multilevel lumbar spine degenerative change, worst at L3 -
L4 and L5 - S1.
IMPRESSION: 1.  Approximately 4 mm stone within the distal aspect of the left
ureter results in mild upstream ureterectasis and pelvocaliectasis.
No additional left-sided renal stones.
2.  Two punctate (2-3 mm) nonobstructing right-sided renal stones.
No evidence of right-sided urinary obstruction.
3.  Suspected hepatic steatosis.
4.  Colonic diverticulosis.
5.  Small to moderate size left-sided indirect mesenteric fat
containing hernia.
6.  Age indeterminate (approximate 25%) focal narrowing involving
the superior endplate of the L4 vertebral body.  Correlation point
tenderness at this location is recommended.
7.  Mild to moderate multilevel lumbar spine degenerative change.

## 2014-07-06 ENCOUNTER — Encounter (HOSPITAL_COMMUNITY): Payer: Self-pay | Admitting: Emergency Medicine

## 2014-07-06 ENCOUNTER — Emergency Department (HOSPITAL_COMMUNITY)
Admission: EM | Admit: 2014-07-06 | Discharge: 2014-07-06 | Disposition: A | Discharge status: Home / Self Care | Payer: Medicaid Other | Attending: Emergency Medicine | Admitting: Emergency Medicine

## 2014-07-06 DIAGNOSIS — R1310 Dysphagia, unspecified: Secondary | ICD-10-CM | POA: Insufficient documentation

## 2014-07-06 DIAGNOSIS — I10 Essential (primary) hypertension: Secondary | ICD-10-CM | POA: Insufficient documentation

## 2014-07-06 DIAGNOSIS — Z79899 Other long term (current) drug therapy: Secondary | ICD-10-CM | POA: Diagnosis not present

## 2014-07-06 DIAGNOSIS — G8929 Other chronic pain: Secondary | ICD-10-CM | POA: Diagnosis not present

## 2014-07-06 DIAGNOSIS — Z8614 Personal history of Methicillin resistant Staphylococcus aureus infection: Secondary | ICD-10-CM | POA: Insufficient documentation

## 2014-07-06 DIAGNOSIS — R599 Enlarged lymph nodes, unspecified: Secondary | ICD-10-CM | POA: Insufficient documentation

## 2014-07-06 DIAGNOSIS — L259 Unspecified contact dermatitis, unspecified cause: Secondary | ICD-10-CM | POA: Insufficient documentation

## 2014-07-06 DIAGNOSIS — F17200 Nicotine dependence, unspecified, uncomplicated: Secondary | ICD-10-CM | POA: Insufficient documentation

## 2014-07-06 DIAGNOSIS — L255 Unspecified contact dermatitis due to plants, except food: Secondary | ICD-10-CM | POA: Insufficient documentation

## 2014-07-06 DIAGNOSIS — Z8719 Personal history of other diseases of the digestive system: Secondary | ICD-10-CM | POA: Insufficient documentation

## 2014-07-06 DIAGNOSIS — Z8639 Personal history of other endocrine, nutritional and metabolic disease: Secondary | ICD-10-CM | POA: Insufficient documentation

## 2014-07-06 DIAGNOSIS — Z862 Personal history of diseases of the blood and blood-forming organs and certain disorders involving the immune mechanism: Secondary | ICD-10-CM | POA: Diagnosis not present

## 2014-07-06 HISTORY — DX: Gout, unspecified: M10.9

## 2014-07-06 MED ORDER — METHYLPREDNISOLONE SODIUM SUCC 125 MG IJ SOLR
125.0000 mg | Freq: Once | INTRAMUSCULAR | Status: AC
  Administered 2014-07-06: 125 mg via INTRAVENOUS
  Filled 2014-07-06: qty 2

## 2014-07-06 MED ORDER — PREDNISONE 10 MG PO TABS: 40.0000 mg | ORAL_TABLET | Freq: Every day | ORAL | Status: DC

## 2014-07-06 MED ORDER — DIPHENHYDRAMINE HCL 25 MG PO TABS: 25.0000 mg | ORAL_TABLET | Freq: Four times a day (QID) | ORAL | Status: DC

## 2014-07-06 MED ORDER — FAMOTIDINE IN NACL 20-0.9 MG/50ML-% IV SOLN
20.0000 mg | Freq: Once | INTRAVENOUS | Status: AC
  Administered 2014-07-06: 20 mg via INTRAVENOUS
  Filled 2014-07-06: qty 50

## 2014-07-06 MED ORDER — FAMOTIDINE 20 MG PO TABS: 20.0000 mg | ORAL_TABLET | Freq: Two times a day (BID) | ORAL | Status: DC

## 2014-07-06 MED ORDER — DIPHENHYDRAMINE HCL 50 MG/ML IJ SOLN
25.0000 mg | Freq: Once | INTRAMUSCULAR | Status: AC
  Administered 2014-07-06: 25 mg via INTRAVENOUS
  Filled 2014-07-06: qty 1

## 2014-07-06 MED ORDER — SODIUM CHLORIDE 0.9 % IV SOLN
Freq: Once | INTRAVENOUS | Status: AC
  Administered 2014-07-06: 21:00:00 via INTRAVENOUS

## 2014-07-06 NOTE — ED Provider Notes (Signed)
CSN: [id number]     Arrival date & time 07/06/14  1907 History   First MD Initiated Contact with Patient 07/06/14 1947     Chief Complaint  Patient presents with  . Poison Ivy  . Throating closing      (Consider location/radiation/quality/duration/timing/severity/associated sxs/prior Treatment) HPI Shawn Travis is a 53 y.o. male who presents to ED with complaint of poison ivy rash. Pt states he pulled weeds in his yard 3 days ago.  States two days ago developed rash to the chest and neck and small area to the left eye brow. Pt states he feels rash is spreading. Pt repots today feeling like his throat is starting to become scratchy. He denies taking any medications for this prior to coming in. Denies shortness of breath. No wheezing or stridor. No sore throat. No changes in vision. No eye pain. No fever, chills, malaise. States his tonsils and lymph nodes are swelling.  No other complaints.   Past Medical History  Diagnosis Date  . Hypertension   . Chronic back pain   . Acid reflux   . Vertigo   . MRSA infection   . Gout   . Gout    History reviewed. No pertinent past surgical history. History reviewed. No pertinent family history. History  Substance Use Topics  . Smoking status: Current Every Day Smoker -- 1.00 packs/day  . Smokeless tobacco: Never Used  . Alcohol Use: No    Review of Systems  Constitutional: Negative for fever and chills.  HENT: Positive for trouble swallowing. Negative for congestion, sore throat and voice change.   Respiratory: Negative for cough, chest tightness and shortness of breath.   Cardiovascular: Negative for chest pain, palpitations and leg swelling.  Gastrointestinal: Negative for nausea, vomiting, abdominal pain, diarrhea and abdominal distention.  Musculoskeletal: Negative for arthralgias, myalgias, neck pain and neck stiffness.  Skin: Positive for rash.  Allergic/Immunologic: Negative for immunocompromised state.  Neurological: Negative for  dizziness, weakness, light-headedness, numbness and headaches.      Allergies  Hydrochlorothiazide  Home Medications   Prior to Admission medications   Medication Sig Start Date End Date Taking? Authorizing Provider  atenolol (TENORMIN) 25 MG tablet Take 25 mg by mouth daily.   Yes Historical Provider, MD  enalapril (VASOTEC) 20 MG tablet Take 20 mg by mouth daily.   Yes Historical Provider, MD  furosemide (LASIX) 20 MG tablet Take 20 mg by mouth.   Yes Historical Provider, MD   BP 189/129  Pulse 90  Temp(Src) 97.8 F (36.6 C) (Oral)  Resp 15  Ht 5\' 9"  (1.753 m)  Wt 210 lb (95.255 kg)  BMI 31.00 kg/m2  SpO2 98% Physical Exam  Nursing note and vitals reviewed. Constitutional: He appears well-developed and well-nourished. No distress.  HENT:  Head: Normocephalic and atraumatic.  Right Ear: External ear normal.  Left Ear: External ear normal.  Nose: Nose normal.  Mouth/Throat: Oropharynx is clear and moist.  Mid erythema to the oropharynx and uvula with no swelling. Uvula midline. No trismus. No swelling under the tongue.   Eyes: Conjunctivae are normal.  Neck: Normal range of motion. Neck supple. No JVD present.  Cardiovascular: Normal rate, regular rhythm and normal heart sounds.   Pulmonary/Chest: Effort normal and breath sounds normal. No stridor. No respiratory distress. He has no wheezes. He has no rales.  Abdominal: Soft. Bowel sounds are normal. He exhibits no distension. There is no tenderness. There is no rebound.  Musculoskeletal: He exhibits no edema.  Lymphadenopathy:  He has cervical adenopathy.  Neurological: He is alert.  Skin: Skin is warm and dry.  Erythematous papular rash to the anterior neck, upper chest. Very small, <1cm area of rash to the left eyebrow    ED Course  Procedures (including critical care time) Labs Review Labs Reviewed - No data to display  Imaging Review No results found.   EKG Interpretation None      MDM   Final  diagnoses:  Contact dermatitis   8:45 PM Patient with IV rash to his neck chest, now with sensation of scratchy throat. He has no stridor, no difficulty breathing, no swelling of oropharynx or uvula. Will treat with IV Solu-Medrol, Pepcid, Benadryl, will monitor.   10:07 PM Pt states his symptoms are improved. He no longer has sensation of throat swelling or difficulty swallowing or breathing. Requesting to be discharged home. Will d/c home with prednisone, benadryl, pepcid. Follow up with pcp.    Lottie Mussel, PA-C 07/06/14 2213

## 2014-07-06 NOTE — ED Notes (Signed)
Patient was working out in the yard Wednesday.  Shaved and now noticed red area on throat and feels like his throat is closing and has noticed red areas on his face

## 2014-07-06 NOTE — Discharge Instructions (Signed)
Continue benadryl, pepcid, prednisone for the next 4 days. Follow up with primary care doctor for recheck in 3 days.    Contact Dermatitis Contact dermatitis is a reaction to certain substances that touch the skin. Contact dermatitis can be either irritant contact dermatitis or allergic contact dermatitis. Irritant contact dermatitis does not require previous exposure to the substance for a reaction to occur.Allergic contact dermatitis only occurs if you have been exposed to the substance before. Upon a repeat exposure, your body reacts to the substance.  CAUSES  Many substances can cause contact dermatitis. Irritant dermatitis is most commonly caused by repeated exposure to mildly irritating substances, such as:  Makeup.  Soaps.  Detergents.  Bleaches.  Acids.  Metal salts, such as nickel. Allergic contact dermatitis is most commonly caused by exposure to:  Poisonous plants.  Chemicals (deodorants, shampoos).  Jewelry.  Latex.  Neomycin in triple antibiotic cream.  Preservatives in products, including clothing. SYMPTOMS  The area of skin that is exposed may develop:  Dryness or flaking.  Redness.  Cracks.  Itching.  Pain or a burning sensation.  Blisters. With allergic contact dermatitis, there may also be swelling in areas such as the eyelids, mouth, or genitals.  DIAGNOSIS  Your caregiver can usually tell what the problem is by doing a physical exam. In cases where the cause is uncertain and an allergic contact dermatitis is suspected, a patch skin test may be performed to help determine the cause of your dermatitis. TREATMENT Treatment includes protecting the skin from further contact with the irritating substance by avoiding that substance if possible. Barrier creams, powders, and gloves may be helpful. Your caregiver may also recommend:  Steroid creams or ointments applied 2 times daily. For best results, soak the rash area in cool water for 20 minutes. Then  apply the medicine. Cover the area with a plastic wrap. You can store the steroid cream in the refrigerator for a "chilly" effect on your rash. That may decrease itching. Oral steroid medicines may be needed in more severe cases.  Antibiotics or antibacterial ointments if a skin infection is present.  Antihistamine lotion or an antihistamine taken by mouth to ease itching.  Lubricants to keep moisture in your skin.  Burow's solution to reduce redness and soreness or to dry a weeping rash. Mix one packet or tablet of solution in 2 cups cool water. Dip a clean washcloth in the mixture, wring it out a bit, and put it on the affected area. Leave the cloth in place for 30 minutes. Do this as often as possible throughout the day.  Taking several cornstarch or baking soda baths daily if the area is too large to cover with a washcloth. Harsh chemicals, such as alkalis or acids, can cause skin damage that is like a burn. You should flush your skin for 15 to 20 minutes with cold water after such an exposure. You should also seek immediate medical care after exposure. Bandages (dressings), antibiotics, and pain medicine may be needed for severely irritated skin.  HOME CARE INSTRUCTIONS  Avoid the substance that caused your reaction.  Keep the area of skin that is affected away from hot water, soap, sunlight, chemicals, acidic substances, or anything else that would irritate your skin.  Do not scratch the rash. Scratching may cause the rash to become infected.  You may take cool baths to help stop the itching.  Only take over-the-counter or prescription medicines as directed by your caregiver.  See your caregiver for follow-up care  as directed to make sure your skin is healing properly. SEEK MEDICAL CARE IF:   Your condition is not better after 3 days of treatment.  You seem to be getting worse.  You see signs of infection such as swelling, tenderness, redness, soreness, or warmth in the affected  area.  You have any problems related to your medicines. Document Released: 10/29/2000 Document Revised: 01/24/2012 Document Reviewed: 04/06/2011 [facility] Patient Information 2015 [place], [place]. This information is not intended to replace advice given to you by your health care provider. Make sure you discuss any questions you have with your health care provider.

## 2014-07-08 NOTE — ED Provider Notes (Signed)
Medical screening examination/treatment/procedure(s) were performed by non-physician practitioner and as supervising physician I was immediately available for consultation/collaboration.   EKG Interpretation None        Joya Gaskins, MD 07/08/14 [phone]

## 2017-06-19 ENCOUNTER — Emergency Department (HOSPITAL_COMMUNITY)
Admission: EM | Admit: 2017-06-19 | Discharge: 2017-06-19 | Discharge status: Against Medical Advice | Payer: Medicaid Other | Attending: Emergency Medicine | Admitting: Emergency Medicine

## 2017-06-19 ENCOUNTER — Encounter (HOSPITAL_COMMUNITY): Payer: Self-pay | Admitting: Nurse Practitioner

## 2017-06-19 DIAGNOSIS — Z5321 Procedure and treatment not carried out due to patient leaving prior to being seen by health care provider: Secondary | ICD-10-CM | POA: Diagnosis not present

## 2017-06-19 DIAGNOSIS — H9202 Otalgia, left ear: Secondary | ICD-10-CM | POA: Insufficient documentation

## 2017-06-19 HISTORY — DX: Pure hypercholesterolemia, unspecified: E78.00

## 2017-06-19 HISTORY — DX: Type 2 diabetes mellitus without complications: E11.9

## 2017-06-19 NOTE — ED Triage Notes (Addendum)
Pt presents with c/o left ear pain.The pain began about two days ago.  He denies any fevers, chills, drainage, nasal or sinus congestion. He has had ear infections in the past which felt like this. He normally wears hearing aids but has not been able to due to the ear pain. His blood pressure is elevated in triage, he reports taking his blood pressure medication daily as prescribed but his blood pressure is normally elevated.

## 2017-06-19 NOTE — ED Notes (Signed)
Pt called to be roomed, no answer  

## 2017-06-19 NOTE — ED Notes (Signed)
Pt called twice, no answer 

## 2017-06-19 NOTE — ED Notes (Signed)
Have called for pt multiple times, is not in lobby

## 2017-12-22 ENCOUNTER — Encounter (HOSPITAL_COMMUNITY): Payer: Self-pay | Admitting: *Deleted

## 2017-12-22 ENCOUNTER — Other Ambulatory Visit: Payer: Self-pay

## 2017-12-22 NOTE — Progress Notes (Signed)
Pt denies SOB, chest pain, and being under the care of a cardiologist. Pt denies having a cardiac cath but stated that a stress test was performed >10 years ago. Requested EKG tracing, echo , LOV note and labs (A1c) from [facility], Dr. Erenest RasherVanstory. Pt made aware to stop taking vitamins, fish oil and herbal medications. Do not take any NSAIDs ie: Ibuprofen, Advil, Naproxen (Aleve), Motrin, BC and Goody Powder or any medication containing Aspirin. Pt stated that he does not check his blood glucose and is not currently on medication for diabetes. Pt verbalized understanding of all pre-op instructions. Pharmacy updated with pt new contact number.

## 2017-12-23 ENCOUNTER — Ambulatory Visit (HOSPITAL_COMMUNITY)
Admission: RE | Admit: 2017-12-23 | Discharge: 2017-12-23 | Disposition: A | Discharge status: Home / Self Care | Payer: Medicaid Other | Source: Ambulatory Visit | Attending: Oral Surgery | Admitting: Oral Surgery

## 2017-12-23 ENCOUNTER — Encounter (HOSPITAL_COMMUNITY): Payer: Self-pay | Admitting: *Deleted

## 2017-12-23 ENCOUNTER — Ambulatory Visit (HOSPITAL_COMMUNITY): Payer: Medicaid Other | Admitting: Certified Registered Nurse Anesthetist

## 2017-12-23 ENCOUNTER — Encounter (HOSPITAL_COMMUNITY)
Admission: RE | Disposition: A | Discharge status: Home / Self Care | Payer: Self-pay | Source: Ambulatory Visit | Attending: Oral Surgery

## 2017-12-23 DIAGNOSIS — F430 Acute stress reaction: Secondary | ICD-10-CM | POA: Insufficient documentation

## 2017-12-23 DIAGNOSIS — Z79899 Other long term (current) drug therapy: Secondary | ICD-10-CM | POA: Diagnosis not present

## 2017-12-23 DIAGNOSIS — I10 Essential (primary) hypertension: Secondary | ICD-10-CM | POA: Diagnosis not present

## 2017-12-23 DIAGNOSIS — M109 Gout, unspecified: Secondary | ICD-10-CM | POA: Insufficient documentation

## 2017-12-23 DIAGNOSIS — E7800 Pure hypercholesterolemia, unspecified: Secondary | ICD-10-CM | POA: Insufficient documentation

## 2017-12-23 DIAGNOSIS — Z888 Allergy status to other drugs, medicaments and biological substances status: Secondary | ICD-10-CM | POA: Diagnosis not present

## 2017-12-23 DIAGNOSIS — K219 Gastro-esophageal reflux disease without esophagitis: Secondary | ICD-10-CM | POA: Insufficient documentation

## 2017-12-23 DIAGNOSIS — E1140 Type 2 diabetes mellitus with diabetic neuropathy, unspecified: Secondary | ICD-10-CM | POA: Diagnosis not present

## 2017-12-23 DIAGNOSIS — Z8614 Personal history of Methicillin resistant Staphylococcus aureus infection: Secondary | ICD-10-CM | POA: Insufficient documentation

## 2017-12-23 DIAGNOSIS — F319 Bipolar disorder, unspecified: Secondary | ICD-10-CM | POA: Insufficient documentation

## 2017-12-23 DIAGNOSIS — F419 Anxiety disorder, unspecified: Secondary | ICD-10-CM | POA: Insufficient documentation

## 2017-12-23 DIAGNOSIS — K029 Dental caries, unspecified: Secondary | ICD-10-CM | POA: Diagnosis present

## 2017-12-23 DIAGNOSIS — Z6832 Body mass index (BMI) 32.0-32.9, adult: Secondary | ICD-10-CM | POA: Diagnosis not present

## 2017-12-23 HISTORY — PX: TOOTH EXTRACTION: SHX859

## 2017-12-23 HISTORY — DX: Unspecified hearing loss, unspecified ear: H91.90

## 2017-12-23 HISTORY — DX: Bipolar disorder, unspecified: F31.9

## 2017-12-23 HISTORY — DX: Major depressive disorder, single episode, unspecified: F32.9

## 2017-12-23 HISTORY — DX: Depression, unspecified: F32.A

## 2017-12-23 HISTORY — DX: Personal history of urinary calculi: Z87.442

## 2017-12-23 HISTORY — DX: Presence of external hearing-aid: Z97.4

## 2017-12-23 HISTORY — DX: Type 2 diabetes mellitus with diabetic neuropathy, unspecified: E11.40

## 2017-12-23 HISTORY — DX: Anxiety disorder, unspecified: F41.9

## 2017-12-23 HISTORY — DX: Dental caries, unspecified: K02.9

## 2017-12-23 LAB — CBC
HEMATOCRIT: 44.7 % (ref 39.0–52.0)
Hemoglobin: 15.6 g/dL (ref 13.0–17.0)
MCH: 28.8 pg (ref 26.0–34.0)
MCHC: 34.9 g/dL (ref 30.0–36.0)
MCV: 82.6 fL (ref 78.0–100.0)
PLATELETS: 223 10*3/uL (ref 150–400)
RBC: 5.41 MIL/uL (ref 4.22–5.81)
RDW: 13.5 % (ref 11.5–15.5)
WBC: 8.7 10*3/uL (ref 4.0–10.5)

## 2017-12-23 LAB — BASIC METABOLIC PANEL
Anion gap: 15 (ref 5–15)
BUN: 12 mg/dL (ref 6–20)
CHLORIDE: 102 mmol/L (ref 101–111)
CO2: 20 mmol/L — ABNORMAL LOW (ref 22–32)
CREATININE: 0.85 mg/dL (ref 0.61–1.24)
Calcium: 9.7 mg/dL (ref 8.9–10.3)
Glucose, Bld: 176 mg/dL — ABNORMAL HIGH (ref 65–99)
POTASSIUM: 3.8 mmol/L (ref 3.5–5.1)
SODIUM: 137 mmol/L (ref 135–145)

## 2017-12-23 LAB — GLUCOSE, CAPILLARY
GLUCOSE-CAPILLARY: 158 mg/dL — AB (ref 65–99)
Glucose-Capillary: 182 mg/dL — ABNORMAL HIGH (ref 65–99)

## 2017-12-23 LAB — HEMOGLOBIN A1C
Hgb A1c MFr Bld: 9.3 % — ABNORMAL HIGH (ref 4.8–5.6)
MEAN PLASMA GLUCOSE: 220.21 mg/dL

## 2017-12-23 SURGERY — DENTAL RESTORATION/EXTRACTIONS
Anesthesia: General | Site: Mouth

## 2017-12-23 MED ORDER — GLYCOPYRROLATE 0.2 MG/ML IJ SOLN
INTRAMUSCULAR | Status: DC | PRN
  Administered 2017-12-23: 0.2 mg via INTRAVENOUS

## 2017-12-23 MED ORDER — DEXAMETHASONE SODIUM PHOSPHATE 10 MG/ML IJ SOLN
INTRAMUSCULAR | Status: AC
  Filled 2017-12-23: qty 1

## 2017-12-23 MED ORDER — MIDAZOLAM HCL 2 MG/2ML IJ SOLN
INTRAMUSCULAR | Status: DC | PRN
  Administered 2017-12-23 (×2): 1 mg via INTRAVENOUS

## 2017-12-23 MED ORDER — DEXAMETHASONE SODIUM PHOSPHATE 10 MG/ML IJ SOLN
INTRAMUSCULAR | Status: DC | PRN
  Administered 2017-12-23: 5 mg via INTRAVENOUS

## 2017-12-23 MED ORDER — SODIUM CHLORIDE 0.9 % IR SOLN
Status: DC | PRN
  Administered 2017-12-23: 1000 mL

## 2017-12-23 MED ORDER — MIDAZOLAM HCL 2 MG/2ML IJ SOLN
INTRAMUSCULAR | Status: AC
  Filled 2017-12-23: qty 2

## 2017-12-23 MED ORDER — PROMETHAZINE HCL 25 MG/ML IJ SOLN: 6.2500 mg | INTRAMUSCULAR | Status: DC | PRN

## 2017-12-23 MED ORDER — PHENYLEPHRINE 40 MCG/ML (10ML) SYRINGE FOR IV PUSH (FOR BLOOD PRESSURE SUPPORT)
PREFILLED_SYRINGE | INTRAVENOUS | Status: AC
  Filled 2017-12-23: qty 20

## 2017-12-23 MED ORDER — OXYCODONE HCL 5 MG/5ML PO SOLN: 5.0000 mg | Freq: Once | ORAL | Status: DC | PRN

## 2017-12-23 MED ORDER — FENTANYL CITRATE (PF) 250 MCG/5ML IJ SOLN
INTRAMUSCULAR | Status: AC
  Filled 2017-12-23: qty 5

## 2017-12-23 MED ORDER — OXYMETAZOLINE HCL 0.05 % NA SOLN
NASAL | Status: DC | PRN
  Administered 2017-12-23: 3 via NASAL

## 2017-12-23 MED ORDER — FENTANYL CITRATE (PF) 250 MCG/5ML IJ SOLN
INTRAMUSCULAR | Status: DC | PRN
  Administered 2017-12-23: 100 ug via INTRAVENOUS

## 2017-12-23 MED ORDER — PHENYLEPHRINE HCL 10 MG/ML IJ SOLN
INTRAMUSCULAR | Status: DC | PRN
  Administered 2017-12-23: 120 ug via INTRAVENOUS
  Administered 2017-12-23: 80 ug via INTRAVENOUS
  Administered 2017-12-23: 120 ug via INTRAVENOUS
  Administered 2017-12-23: 80 ug via INTRAVENOUS
  Administered 2017-12-23 (×2): 200 ug via INTRAVENOUS

## 2017-12-23 MED ORDER — LIDOCAINE-EPINEPHRINE 2 %-1:100000 IJ SOLN
INTRAMUSCULAR | Status: DC | PRN
  Administered 2017-12-23: 20 mL

## 2017-12-23 MED ORDER — SUCCINYLCHOLINE CHLORIDE 20 MG/ML IJ SOLN
INTRAMUSCULAR | Status: DC | PRN
  Administered 2017-12-23: 120 mg via INTRAVENOUS

## 2017-12-23 MED ORDER — METOPROLOL TARTRATE 5 MG/5ML IV SOLN
INTRAVENOUS | Status: DC | PRN
  Administered 2017-12-23: 1 mg via INTRAVENOUS
  Administered 2017-12-23: 2 mg via INTRAVENOUS

## 2017-12-23 MED ORDER — LACTATED RINGERS IV SOLN
INTRAVENOUS | Status: DC | PRN
  Administered 2017-12-23 (×2): via INTRAVENOUS

## 2017-12-23 MED ORDER — OXYCODONE-ACETAMINOPHEN 5-325 MG PO TABS: 1.0000 | ORAL_TABLET | ORAL | 0 refills | Status: AC | PRN

## 2017-12-23 MED ORDER — HYDROMORPHONE HCL 1 MG/ML IJ SOLN: 0.2500 mg | INTRAMUSCULAR | Status: DC | PRN

## 2017-12-23 MED ORDER — OXYMETAZOLINE HCL 0.05 % NA SOLN
NASAL | Status: AC
  Filled 2017-12-23: qty 15

## 2017-12-23 MED ORDER — ALBUTEROL SULFATE HFA 108 (90 BASE) MCG/ACT IN AERS
INHALATION_SPRAY | RESPIRATORY_TRACT | Status: DC | PRN
  Administered 2017-12-23: 2 via RESPIRATORY_TRACT

## 2017-12-23 MED ORDER — LIDOCAINE 2% (20 MG/ML) 5 ML SYRINGE
INTRAMUSCULAR | Status: AC
  Filled 2017-12-23: qty 5

## 2017-12-23 MED ORDER — ONDANSETRON HCL 4 MG/2ML IJ SOLN
INTRAMUSCULAR | Status: AC
  Filled 2017-12-23: qty 2

## 2017-12-23 MED ORDER — LIDOCAINE-EPINEPHRINE 2 %-1:100000 IJ SOLN
INTRAMUSCULAR | Status: AC
  Filled 2017-12-23: qty 2

## 2017-12-23 MED ORDER — OXYCODONE HCL 5 MG PO TABS: 5.0000 mg | ORAL_TABLET | Freq: Once | ORAL | Status: DC | PRN

## 2017-12-23 MED ORDER — AMOXICILLIN 500 MG PO CAPS: 500.0000 mg | ORAL_CAPSULE | Freq: Three times a day (TID) | ORAL | 0 refills | Status: AC

## 2017-12-23 MED ORDER — PROPOFOL 10 MG/ML IV BOLUS
INTRAVENOUS | Status: AC
  Filled 2017-12-23: qty 20

## 2017-12-23 MED ORDER — 0.9 % SODIUM CHLORIDE (POUR BTL) OPTIME
TOPICAL | Status: DC | PRN
  Administered 2017-12-23: 1000 mL

## 2017-12-23 MED ORDER — CEFAZOLIN SODIUM-DEXTROSE 2-4 GM/100ML-% IV SOLN
2.0000 g | INTRAVENOUS | Status: AC
  Administered 2017-12-23: 2 g via INTRAVENOUS
  Filled 2017-12-23: qty 100

## 2017-12-23 MED ORDER — PROPOFOL 10 MG/ML IV BOLUS
INTRAVENOUS | Status: DC | PRN
  Administered 2017-12-23: 25 mg via INTRAVENOUS
  Administered 2017-12-23: 175 mg via INTRAVENOUS

## 2017-12-23 MED ORDER — ALBUTEROL SULFATE HFA 108 (90 BASE) MCG/ACT IN AERS
INHALATION_SPRAY | RESPIRATORY_TRACT | Status: AC
  Filled 2017-12-23: qty 6.7

## 2017-12-23 MED ORDER — SUCCINYLCHOLINE CHLORIDE 200 MG/10ML IV SOSY
PREFILLED_SYRINGE | INTRAVENOUS | Status: AC
  Filled 2017-12-23: qty 10

## 2017-12-23 MED ORDER — ONDANSETRON HCL 4 MG/2ML IJ SOLN
INTRAMUSCULAR | Status: DC | PRN
  Administered 2017-12-23: 4 mg via INTRAVENOUS

## 2017-12-23 MED ORDER — LIDOCAINE HCL (CARDIAC) 20 MG/ML IV SOLN
INTRAVENOUS | Status: DC | PRN
  Administered 2017-12-23: 100 mg via INTRATRACHEAL

## 2017-12-23 MED ORDER — MEPERIDINE HCL 50 MG/ML IJ SOLN: 6.2500 mg | INTRAMUSCULAR | Status: DC | PRN

## 2017-12-23 SURGICAL SUPPLY — 33 items
BLADE SURG 15 STRL LF DISP TIS (BLADE) IMPLANT
BLADE SURG 15 STRL SS (BLADE)
BUR CROSS CUT FISSURE 1.6 (BURR) ×2 IMPLANT
BUR CROSS CUT FISSURE 1.6MM (BURR) ×1
BUR EGG ELITE 4.0 (BURR) ×2 IMPLANT
BUR EGG ELITE 4.0MM (BURR) ×1
CANISTER SUCT 3000ML PPV (MISCELLANEOUS) ×3 IMPLANT
COVER SURGICAL LIGHT HANDLE (MISCELLANEOUS) ×3 IMPLANT
GAUZE PACKING FOLDED 2  STR (GAUZE/BANDAGES/DRESSINGS) ×2
GAUZE PACKING FOLDED 2 STR (GAUZE/BANDAGES/DRESSINGS) ×1 IMPLANT
GLOVE BIO SURGEON STRL SZ 6.5 (GLOVE) ×2 IMPLANT
GLOVE BIO SURGEON STRL SZ7 (GLOVE) ×3 IMPLANT
GLOVE BIO SURGEON STRL SZ7.5 (GLOVE) ×3 IMPLANT
GLOVE BIO SURGEONS STRL SZ 6.5 (GLOVE) ×1
GLOVE BIOGEL PI IND STRL 6.5 (GLOVE) IMPLANT
GLOVE BIOGEL PI IND STRL 7.0 (GLOVE) IMPLANT
GLOVE BIOGEL PI INDICATOR 6.5 (GLOVE)
GLOVE BIOGEL PI INDICATOR 7.0 (GLOVE)
GOWN STRL REUS W/ TWL LRG LVL3 (GOWN DISPOSABLE) ×1 IMPLANT
GOWN STRL REUS W/ TWL XL LVL3 (GOWN DISPOSABLE) ×1 IMPLANT
GOWN STRL REUS W/TWL LRG LVL3 (GOWN DISPOSABLE) ×2
GOWN STRL REUS W/TWL XL LVL3 (GOWN DISPOSABLE) ×2
KIT BASIN OR (CUSTOM PROCEDURE TRAY) ×3 IMPLANT
KIT ROOM TURNOVER OR (KITS) ×3 IMPLANT
NEEDLE 22X1 1/2 (OR ONLY) (NEEDLE) ×6 IMPLANT
NS IRRIG 1000ML POUR BTL (IV SOLUTION) ×3 IMPLANT
PAD ARMBOARD 7.5X6 YLW CONV (MISCELLANEOUS) ×3 IMPLANT
SPONGE SURGIFOAM ABS GEL 12-7 (HEMOSTASIS) IMPLANT
SUT CHROMIC 3 0 PS 2 (SUTURE) ×6 IMPLANT
SYR CONTROL 10ML LL (SYRINGE) ×6 IMPLANT
TRAY ENT MC OR (CUSTOM PROCEDURE TRAY) ×3 IMPLANT
TUBING IRRIGATION (MISCELLANEOUS) ×3 IMPLANT
YANKAUER SUCT BULB TIP NO VENT (SUCTIONS) ×3 IMPLANT

## 2017-12-23 NOTE — Transfer of Care (Signed)
Immediate Anesthesia Transfer of Care Note  Patient: Shawn Travis  Procedure(s) Performed: EXTRACTIONS OF TEETH NUMBERS SIX, SEVEN, EIGHT, NINE, TEN, ELEVEN, FOURTEEN, EIGHTEEN, TWENTY-THREE, TWENTY-FOUR, TWENTY-FIVE, TWENTY SIX, THIRTY-ONE, THIRTY-TWO AND ALVEOLOPLASTY. (N/A Mouth)  Patient Location: PACU  Anesthesia Type:General  Level of Consciousness: awake, alert  and pateint uncooperative  Airway & Oxygen Therapy: Patient Spontanous Breathing and Patient connected to nasal cannula oxygen  Post-op Assessment: Report given to RN, Post -op Vital signs reviewed and stable, Patient moving all extremities X 4 and Patient able to stick tongue midline  Post vital signs: Reviewed and stable  Last Vitals:  Vitals:   12/23/17 0632 12/23/17 0839  BP: (!) 169/95 (!) 166/108  Pulse:  89  Resp:  (!) 21  Temp:  (!) 36 C  SpO2:  93%    Last Pain:  Vitals:   12/23/17 0617  TempSrc: Oral      Patients Stated Pain Goal: 1 (12/23/17 [id number])  Complications: No apparent anesthesia complications

## 2017-12-23 NOTE — Anesthesia Preprocedure Evaluation (Signed)
Anesthesia Evaluation    Airway Mallampati: II  TM Distance: >3 FB Neck ROM: Full    Dental no notable dental hx.    Pulmonary Current Smoker,    Pulmonary exam normal breath sounds clear to auscultation       Cardiovascular hypertension, Pt. on medications Normal cardiovascular exam Rhythm:Regular Rate:Normal     Neuro/Psych Anxiety Depression Bipolar Disorder    GI/Hepatic GERD  ,  Endo/Other    Renal/GU      Musculoskeletal   Abdominal   Peds  Hematology   Anesthesia Other Findings   Reproductive/Obstetrics                             Anesthesia Physical Anesthesia Plan  ASA: II  Anesthesia Plan: General   Post-op Pain Management:    Induction: Intravenous  PONV Risk Score and Plan: 1 and Ondansetron  Airway Management Planned: Nasal ETT  Additional Equipment:   Intra-op Plan:   Post-operative Plan: Extubation in OR  Informed Consent: I have reviewed the patients History and Physical, chart, labs and discussed the procedure including the risks, benefits and alternatives for the proposed anesthesia with the patient or authorized representative who has indicated his/her understanding and acceptance.   Dental advisory given  Plan Discussed with: CRNA  Anesthesia Plan Comments:         Anesthesia Quick Evaluation

## 2017-12-23 NOTE — Progress Notes (Signed)
Pts BP remains high, 160-170s over 100s, Dr Hyacinth MeekerMiller aware and ok for pt to be dc home

## 2017-12-23 NOTE — H&P (Signed)
HISTORY AND PHYSICAL  Shawn Travis is a 57 y.o. male patient with CC: painful teeth  No diagnosis found.  Past Medical History:  Diagnosis Date  . Acid reflux   . Anxiety   . Bipolar disorder (HCC)   . Chronic back pain   . Dental caries   . Depression   . Diabetes mellitus without complication (HCC)   . Diabetic neuropathy (HCC)   . Gout   . Gout   . History of kidney stones   . HOH (hard of hearing)   . Hypercholesteremia   . Hypertension   . MRSA infection   . Vertigo   . Wears hearing aid in both ears     Current Facility-Administered Medications  Medication Dose Route Frequency Provider Last Rate Last Dose  . ceFAZolin (ANCEF) IVPB 2g/100 mL premix  2 g Intravenous On Call to OR Ocie DoyneJensen, Georgenia Salim, DDS       Facility-Administered Medications Ordered in Other Encounters  Medication Dose Route Frequency Provider Last Rate Last Dose  . glycopyrrolate (ROBINUL) injection    Anesthesia Intra-op Wall, Norm SaltWilliam Corey, CRNA   0.2 mg at 12/23/17 0703  . lactated ringers infusion    Continuous PRN Wall, Norm SaltWilliam Corey, CRNA      . metoprolol tartrate (LOPRESSOR) injection    Anesthesia Intra-op Wall, Norm SaltWilliam Corey, CRNA   1 mg at 12/23/17 0718  . ondansetron [facility](ZOFRAN) injection    Anesthesia Intra-op Wall, Norm SaltWilliam Corey, CRNA   4 mg at 12/23/17 0703  . oxymetazoline (AFRIN) 0.05 % nasal spray    Anesthesia Intra-op Wall, Norm SaltWilliam Corey, CRNA   3 spray at 12/23/17 [phone]   Allergies  Allergen Reactions  . Hydrochlorothiazide Swelling and Other (See Comments)    To lips   Active Problems:   * No active hospital problems. *  Vitals: Blood pressure (!) 169/95, pulse 87, temperature 97.7 F (36.5 C), temperature source Oral, resp. rate 20, height 5\' 9"  (1.753 m), weight 220 lb (99.8 kg). Lab results: Results for orders placed or performed during the hospital encounter of 12/23/17 (from the past 24 hour(s))  Hemoglobin A1c     Status: Abnormal   Collection Time: 12/23/17  6:22 AM   Result Value Ref Range   Hgb A1c MFr Bld 9.3 (H) 4.8 - 5.6 %   Mean Plasma Glucose 220.21 mg/dL  Basic metabolic panel     Status: Abnormal   Collection Time: 12/23/17  6:22 AM  Result Value Ref Range   Sodium 137 135 - 145 mmol/L   Potassium 3.8 3.5 - 5.1 mmol/L   Chloride 102 101 - 111 mmol/L   CO2 20 (L) 22 - 32 mmol/L   Glucose, Bld 176 (H) 65 - 99 mg/dL   BUN 12 6 - 20 mg/dL   Creatinine, Ser 5.620.85 0.61 - 1.24 mg/dL   Calcium 9.7 8.9 - 13.010.3 mg/dL   GFR calc non Af Amer >60 >60 mL/min   GFR calc Af Amer >60 >60 mL/min   Anion gap 15 5 - 15  CBC     Status: None   Collection Time: 12/23/17  6:22 AM  Result Value Ref Range   WBC 8.7 4.0 - 10.5 K/uL   RBC 5.41 4.22 - 5.81 MIL/uL   Hemoglobin 15.6 13.0 - 17.0 g/dL   HCT 86.544.7 78.439.0 - 69.652.0 %   MCV 82.6 78.0 - 100.0 fL   MCH 28.8 26.0 - 34.0 pg   MCHC 34.9 30.0 - 36.0 g/dL  RDW 13.5 11.5 - 15.5 %   Platelets 223 150 - 400 K/uL  Glucose, capillary     Status: Abnormal   Collection Time: 12/23/17  6:29 AM  Result Value Ref Range   Glucose-Capillary 182 (H) 65 - 99 mg/dL   Radiology Results: No results found. General appearance: alert, cooperative and morbidly obese Head: Normocephalic, without obvious abnormality, atraumatic Eyes: negative Nose: Nares normal. Septum midline. Mucosa normal. No drainage or sinus tenderness. Throat: multiple carious teeth. no purulence, fluctuance, edema. Pharynx clear Neck: no adenopathy, supple, symmetrical, trachea midline and thyroid not enlarged, symmetric, no tenderness/mass/nodules Resp: clear to auscultation bilaterally Cardio: regular rate and rhythm, S1, S2 normal, no murmur, click, rub or gallop  Assessment: Multiple carious teeth.  Plan: Multiple dental extractions. GA. Nasal. Day surgery.   Ocie Doyne 12/23/2017

## 2017-12-23 NOTE — Anesthesia Procedure Notes (Signed)
Procedure Name: Intubation Date/Time: 12/23/2017 7:40 AM Performed by: Lynda Rainwater, MD Pre-anesthesia Checklist: Patient identified, Emergency Drugs available, Suction available and Patient being monitored Patient Re-evaluated:Patient Re-evaluated prior to induction Oxygen Delivery Method: Circle system utilized Preoxygenation: Pre-oxygenation with 100% oxygen Induction Type: IV induction Ventilation: Mask ventilation without difficulty and Nasal airway inserted- appropriate to patient size Laryngoscope Size: Mac and 3 Grade View: Grade II Nasal Tubes: Left and Magill forceps- large, utilized Tube size: 6.5 mm Number of attempts: 1 Placement Confirmation: ETT inserted through vocal cords under direct vision,  positive ETCO2 and breath sounds checked- equal and bilateral Secured at: 27 cm Tube secured with: Tape Dental Injury: Teeth and Oropharynx as per pre-operative assessment

## 2017-12-23 NOTE — Op Note (Signed)
12/23/2017  8:25 AM  PATIENT:  Shawn Travis  57 y.o. male  PRE-OPERATIVE DIAGNOSIS:  NONRESTORABLE TEETH NUMBERS SIX, SEVEN, EIGHT, NINE, TEN, ELEVEN, FOURTEEN, EIGHTEEN, TWENTY-THREE, TWENTY-FOUR, TWENTY-FIVE, TWENTY SIX, THIRTY-ONE, THIRTY-TWO  POST-OPERATIVE DIAGNOSIS:  SAME  PROCEDURE:  Procedure(s): EXTRACTIONS OF TEETH NUMBERS SIX, SEVEN, EIGHT, NINE, TEN, ELEVEN, FOURTEEN, EIGHTEEN, TWENTY-THREE, TWENTY-FOUR, TWENTY-FIVE, TWENTY SIX, THIRTY-ONE, THIRTY-TWO AND ALVEOLOPLASTY RIGHT AND LEFT MAXILLA AND MANDIBLE  SURGEON:  Surgeon(s): Ocie DoyneJensen, Traci Plemons, DDS  ANESTHESIA:   local and general  EBL:  50CC  DRAINS: none   SPECIMEN:  No Specimen  COUNTS:  YES  PLAN OF CARE: Discharge to home after PACU  PATIENT DISPOSITION:  PACU - hemodynamically stable.   PROCEDURE DETAILS: Dictation # [id number]  Georgia LopesScott M. Bralen Wiltgen, DMD 12/23/2017 8:25 AM

## 2017-12-23 NOTE — Anesthesia Postprocedure Evaluation (Signed)
Anesthesia Post Note  Patient: Shawn Travis  Procedure(s) Performed: EXTRACTIONS OF TEETH NUMBERS SIX, SEVEN, EIGHT, NINE, TEN, ELEVEN, FOURTEEN, EIGHTEEN, TWENTY-THREE, TWENTY-FOUR, TWENTY-FIVE, TWENTY SIX, THIRTY-ONE, THIRTY-TWO AND ALVEOLOPLASTY. (N/A Mouth)     Patient location during evaluation: PACU Anesthesia Type: General Level of consciousness: awake and alert Pain management: pain level controlled Vital Signs Assessment: post-procedure vital signs reviewed and stable Respiratory status: spontaneous breathing, nonlabored ventilation and respiratory function stable Cardiovascular status: blood pressure returned to baseline and stable Postop Assessment: no apparent nausea or vomiting Anesthetic complications: no    Last Vitals:  Vitals:   12/23/17 0848 12/23/17 0900  BP: (!) 161/102 (!) 159/106  Pulse: 86 85  Resp: (!) 21 18  Temp:  (!) 36.1 C  SpO2: 94% 94%    Last Pain:  Vitals:   12/23/17 0617  TempSrc: Oral                 Lowella CurbWarren Ray Miller

## 2017-12-24 ENCOUNTER — Encounter (HOSPITAL_COMMUNITY): Payer: Self-pay | Admitting: Oral Surgery

## 2017-12-24 NOTE — Op Note (Signed)
NAME:  Shawn Travis, Shawn Travis NO.:  [id number]  MEDICAL RECORD NO.:  [id number]  LOCATION:  [facility]                         FACILITY:  [facility]  PHYSICIAN:  Georgia Lopes, M.D.  DATE OF BIRTH:  1961-06-20  DATE OF PROCEDURE:  12/23/2017 DATE OF DISCHARGE:                              OPERATIVE REPORT   PREOPERATIVE DIAGNOSES:  Non-restorable teeth numbers 6, 7, 8, 9, 10, 11, 14, 18, 23, 24, 25, 26, 31, and 32 secondary to dental caries.  POSTOPERATIVE DIAGNOSES:  Non-restorable teeth numbers 6, 7, 8, 9, 10, 11, 14, 18, 23, 24, 25, 26, 31, and 32 secondary to dental caries.  PROCEDURES PERFORMED: 1. Extraction of teeth numbers 6, 7, 8, 9, 10, 11, 14, 18, 23, 24, 25,     26, 31, and 32. 2. Alveoloplasty of right and left maxillae and mandible.  ANESTHESIA:  General nasal intubation with Dr. Hyacinth Meeker attending.  DESCRIPTION OF PROCEDURE:  The patient was taken to the operating room and placed on the table in a supine position.  General anesthesia was administered, and a nasal endotracheal tube was placed and secured.  The eyes were protected and the patient was draped for surgery.  A time-out was performed.  The posterior pharynx was suctioned and a throat pack was placed.  The 2% lidocaine with 1:100,000 epinephrine was infiltrated in an inferior alveolar block on the right and left sides and buccal infiltration of the anterior mandible.  Then, the same local was used to anesthetize in the buccal and palatal areas of the maxilla in preparation to the teeth to be removed.  A bite block was placed on the right side of the mouth and a sweetheart retractor was used to retract the tongue.  A #15-blade was used to make an incision around tooth #18, and this incision was carried forward along the alveolar crest to tooth #21.  Then, buccal and lingual incisions were made around teeth numbers 23, 24, 25, and 26 in the mandible and then in the maxilla.  A #15-blade was used to make  an incision around teeth numbers 11, 10, 9, 8, 7, and 6 with an extension proximally for 1 cm on the right and left sides to allow for an alveoloplasty.  The periosteum was reflected from around these teeth, and the #301 elevator was used to elevate the teeth.  Teeth numbers 18, 23, 24, 25, and 26 were removed with the forceps in the maxilla.  Teeth numbers 14, 9, 8, 7, and 6 were removed with the maxillary #150 forceps. Teeth numbers 10 and 11 were fractured upon attempted removal requiring the Stryker handpiece with a fissure bur to remove additional bone around these teeth.  The teeth were then removed with a forceps.  Then, the periosteum was reflected to expose the alveolar crest, which was irregular in both arches, and alveoloplasty was performed using the egg- shaped bur, followed by the bone file.  The areas were then irrigated and closed with 3-0 chromic.  The sweetheart retractor was repositioned at the outside of the mouth. An incision was made around teeth numbers #31 and #32.  The periosteum was reflected, and then bone was removed  around tooth #32, and teeth numbers 31 and 32 were removed with the #301 elevator and dental forceps.  The socket was curetted, irrigated, and closed with 3-0 chromic after performing alveoloplasty on the buccal segment of the mandible in this region.  Egg-shaped bur and bone file were used for the alveoloplasty.  Then, the oral cavity was inspected and found to have good contour, hemostasis, and closure.  The oral cavity was irrigated and suctioned, and the throat pack was removed.  DISPOSITION:  The patient was left under the care of Anesthesia for extubation and transportation to the recovery room, with plans for discharge home through Day Surgery.  ESTIMATED BLOOD LOSS:  50 mL.  COMPLICATIONS:  None.  SPECIMENS:  None.     Georgia Lopes, M.D.     SMJ/MEDQ  D:  12/23/2017  T:  12/24/2017  Job:  [id number]

## 2021-11-15 DEATH — deceased
